# Patient Record
Sex: Male | Born: 1980 | Race: Black or African American | Hispanic: No | Marital: Single | Smoking: Never smoker
Health system: Southern US, Community
[De-identification: ages and names within clinical notes are randomized; demographics above are authoritative.]

## PROBLEM LIST (undated history)

## (undated) DIAGNOSIS — K219 Gastro-esophageal reflux disease without esophagitis: Secondary | ICD-10-CM

## (undated) HISTORY — PX: TOOTH EXTRACTION: SUR596

---
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
## 2005-11-25 ENCOUNTER — Emergency Department (HOSPITAL_COMMUNITY): Admission: EM | Admit: 2005-11-25 | Discharge: 2005-11-25 | Payer: Self-pay | Admitting: Emergency Medicine

## 2007-05-25 ENCOUNTER — Emergency Department (HOSPITAL_COMMUNITY): Admission: EM | Admit: 2007-05-25 | Discharge: 2007-05-25 | Payer: Self-pay | Admitting: Emergency Medicine

## 2009-02-24 ENCOUNTER — Emergency Department (HOSPITAL_BASED_OUTPATIENT_CLINIC_OR_DEPARTMENT_OTHER): Admission: EM | Admit: 2009-02-24 | Discharge: 2009-02-24 | Payer: Self-pay | Admitting: Emergency Medicine

## 2009-02-24 ENCOUNTER — Ambulatory Visit: Payer: Self-pay | Admitting: Diagnostic Radiology

## 2013-04-25 ENCOUNTER — Encounter (HOSPITAL_BASED_OUTPATIENT_CLINIC_OR_DEPARTMENT_OTHER): Payer: Self-pay | Admitting: *Deleted

## 2013-04-25 ENCOUNTER — Emergency Department (HOSPITAL_BASED_OUTPATIENT_CLINIC_OR_DEPARTMENT_OTHER)
Admission: EM | Admit: 2013-04-25 | Discharge: 2013-04-25 | Disposition: A | Discharge status: Home / Self Care | Payer: Self-pay | Attending: Emergency Medicine | Admitting: Emergency Medicine

## 2013-04-25 ENCOUNTER — Emergency Department (HOSPITAL_BASED_OUTPATIENT_CLINIC_OR_DEPARTMENT_OTHER): Payer: Self-pay

## 2013-04-25 DIAGNOSIS — R091 Pleurisy: Secondary | ICD-10-CM | POA: Insufficient documentation

## 2013-04-25 DIAGNOSIS — R0789 Other chest pain: Secondary | ICD-10-CM

## 2013-04-25 DIAGNOSIS — R071 Chest pain on breathing: Secondary | ICD-10-CM | POA: Insufficient documentation

## 2013-04-25 DIAGNOSIS — Z792 Long term (current) use of antibiotics: Secondary | ICD-10-CM | POA: Insufficient documentation

## 2013-04-25 DIAGNOSIS — F17200 Nicotine dependence, unspecified, uncomplicated: Secondary | ICD-10-CM | POA: Insufficient documentation

## 2013-04-25 LAB — COMPREHENSIVE METABOLIC PANEL
ALT: 10 U/L (ref 0–53)
AST: 12 U/L (ref 0–37)
BUN: 8 mg/dL (ref 6–23)
CO2: 27 mEq/L (ref 19–32)
Calcium: 9.8 mg/dL (ref 8.4–10.5)
Creatinine, Ser: 1 mg/dL (ref 0.50–1.35)
GFR calc Af Amer: >90 mL/min (ref >90)
GFR calc non Af Amer: >90 mL/min (ref >90)
Sodium: 142 mEq/L (ref 135–145)
Total Bilirubin: 0.6 mg/dL (ref 0.3–1.2)

## 2013-04-25 LAB — CBC WITH DIFFERENTIAL/PLATELET
Basophils Absolute: 0 10*3/uL (ref 0.0–0.1)
Basophils Relative: 0 % (ref 0–1)
Eosinophils Absolute: 0.1 10*3/uL (ref 0.0–0.7)
Hemoglobin: 13.4 g/dL (ref 13.0–17.0)
Lymphs Abs: 2.3 10*3/uL (ref 0.7–4.0)
MCH: 29.8 pg (ref 26.0–34.0)
MCHC: 32.4 g/dL (ref 30.0–36.0)
Monocytes Relative: 7 % (ref 3–12)
Neutrophils Relative %: 67 % (ref 43–77)
Platelets: 288 10*3/uL (ref 150–400)
RDW: 12.2 % (ref 11.5–15.5)
WBC: 9.3 10*3/uL (ref 4.0–10.5)

## 2013-04-25 LAB — D-DIMER, QUANTITATIVE: D-Dimer, Quant: <0.27 ug/mL-FEU (ref 0.00–0.48)

## 2013-04-25 MED ORDER — TRAMADOL HCL 50 MG PO TABS: 50.0000 mg | ORAL_TABLET | Freq: Four times a day (QID) | ORAL | Status: DC | PRN

## 2013-04-25 NOTE — ED Notes (Signed)
Pt amb to room 5 with quick steady gait in nad. Pt smiling, reports he has been "hitting the gym hard, every day", states he has had back pain for year, worse since Saturday, and "sharp" pains going through his back into his right rib area. Area is non-tender, pain increases with laying on that side, raising his right arm, and taking a deep breath. Pt states he googled his symptoms and became concerned, so came here to get checked.

## 2013-04-25 NOTE — ED Provider Notes (Signed)
CSN: [id number]     Arrival date & time 04/25/13  1004 History   First MD Initiated Contact with Patient 04/25/13 1053     Chief Complaint  Patient presents with  . Back Pain  . Pleurisy   (Consider location/radiation/quality/duration/timing/severity/associated sxs/prior Treatment) HPI Comments: Patient is an otherwise healthy 32 year old male presents with complaints of pain in the right chest. He states that this started Saturday shortly after a vigorous exercise routine at the gym. He is unsure if he strained something but has been uncomfortable since that time. His pain is worse with taking a deep breath and laying on his right side. He denies any fevers or chills. He denies any productive cough. He denies any recent exertional symptoms and normally gets there his workout routine without the symptoms.  Patient is a 32 y.o. male presenting with chest pain. The history is provided by the patient.  Chest Pain Pain location:  R chest Pain quality: sharp   Pain radiates to:  Does not radiate Pain radiates to the back: no   Pain severity:  Moderate Onset quality:  Sudden Duration:  3 days Timing:  Constant Progression:  Worsening Chronicity:  New Context: breathing, lifting and raising an arm   Relieved by:  Nothing Worsened by:  Coughing, deep breathing, certain positions and movement Ineffective treatments:  None tried   History reviewed. No pertinent past medical history. Past Surgical History  Procedure Laterality Date  . Tooth extraction     No family history on file. History  Substance Use Topics  . Smoking status: Current Every Day Smoker  . Smokeless tobacco: Not on file  . Alcohol Use: Not on file    Review of Systems  Cardiovascular: Positive for chest pain.  All other systems reviewed and are negative.    Allergies  Review of patient's allergies indicates no known allergies.  Home Medications   Current Outpatient Rx  Name  Route  Sig  Dispense  Refill   . penicillin v potassium (VEETID) 250 MG tablet   Oral   Take 250 mg by mouth 4 (four) times daily.          BP 119/67  Pulse 58  Temp(Src) 98.2 F (36.8 C) (Oral)  Resp 20  Ht 6' (1.829 m)  Wt 170 lb (77.111 kg)  BMI 23.05 kg/m2  SpO2 100% Physical Exam  Nursing note and vitals reviewed. Constitutional: He is oriented to person, place, and time. He appears well-developed and well-nourished. No distress.  HENT:  Head: Normocephalic and atraumatic.  Mouth/Throat: Oropharynx is clear and moist.  Neck: Normal range of motion. Neck supple.  Cardiovascular: Normal rate, regular rhythm and normal heart sounds.   No murmur heard. Pulmonary/Chest: Effort normal and breath sounds normal. No respiratory distress. He has no wheezes. He exhibits tenderness.  There is mild tenderness to palpation in the right lateral chest wall. Breath sounds are clear and equal bilaterally.  Abdominal: Soft. Bowel sounds are normal. He exhibits no distension. There is no tenderness.  Musculoskeletal: Normal range of motion. He exhibits edema.  Neurological: He is alert and oriented to person, place, and time.  Skin: Skin is warm and dry. He is not diaphoretic.    ED Course  Procedures (including critical care time) Labs Review Labs Reviewed  CBC WITH DIFFERENTIAL  TROPONIN I  D-DIMER, QUANTITATIVE  COMPREHENSIVE METABOLIC PANEL   Imaging Review No results found.   Date: 04/25/2013  Rate: 51  Rhythm: sinus bradycardia  QRS Axis: normal  Intervals: normal  ST/T Wave abnormalities: normal  Conduction Disutrbances:none  Narrative Interpretation:   Old EKG Reviewed: none available    MDM  No diagnosis found. Patient is an otherwise healthy 32 year old male who presents with pleuritic, reproducible right-sided chest pain. This started the day after a vigorous exercise routine. He stated he did a rowing machine for a prolonged period of time. I suspect that his symptoms are likely  musculoskeletal in nature. Workup confirms my suspicions as there is no evidence for cardiac ischemia. Troponin is negative EKG is normal. Chest x-ray does not reveal a pneumothorax or other pathology. D-dimer is negative and I will is suspicion for PE. I feel this sufficiently rule this possibility out. I will discharge him to home with anti-inflammatories, rest, and when necessary followup if not improving in the next few days.    Geoffery Lyons, MD 04/25/13 1213

## 2014-03-03 IMAGING — CR DG CHEST 2V
2 series · 2 of 2 positions shown · non-contrast
Comparison: None.

CLINICAL DATA: Right-sided chest pain and shortness of breath.

EXAM:
CHEST  2 VIEW

[w chest pa]
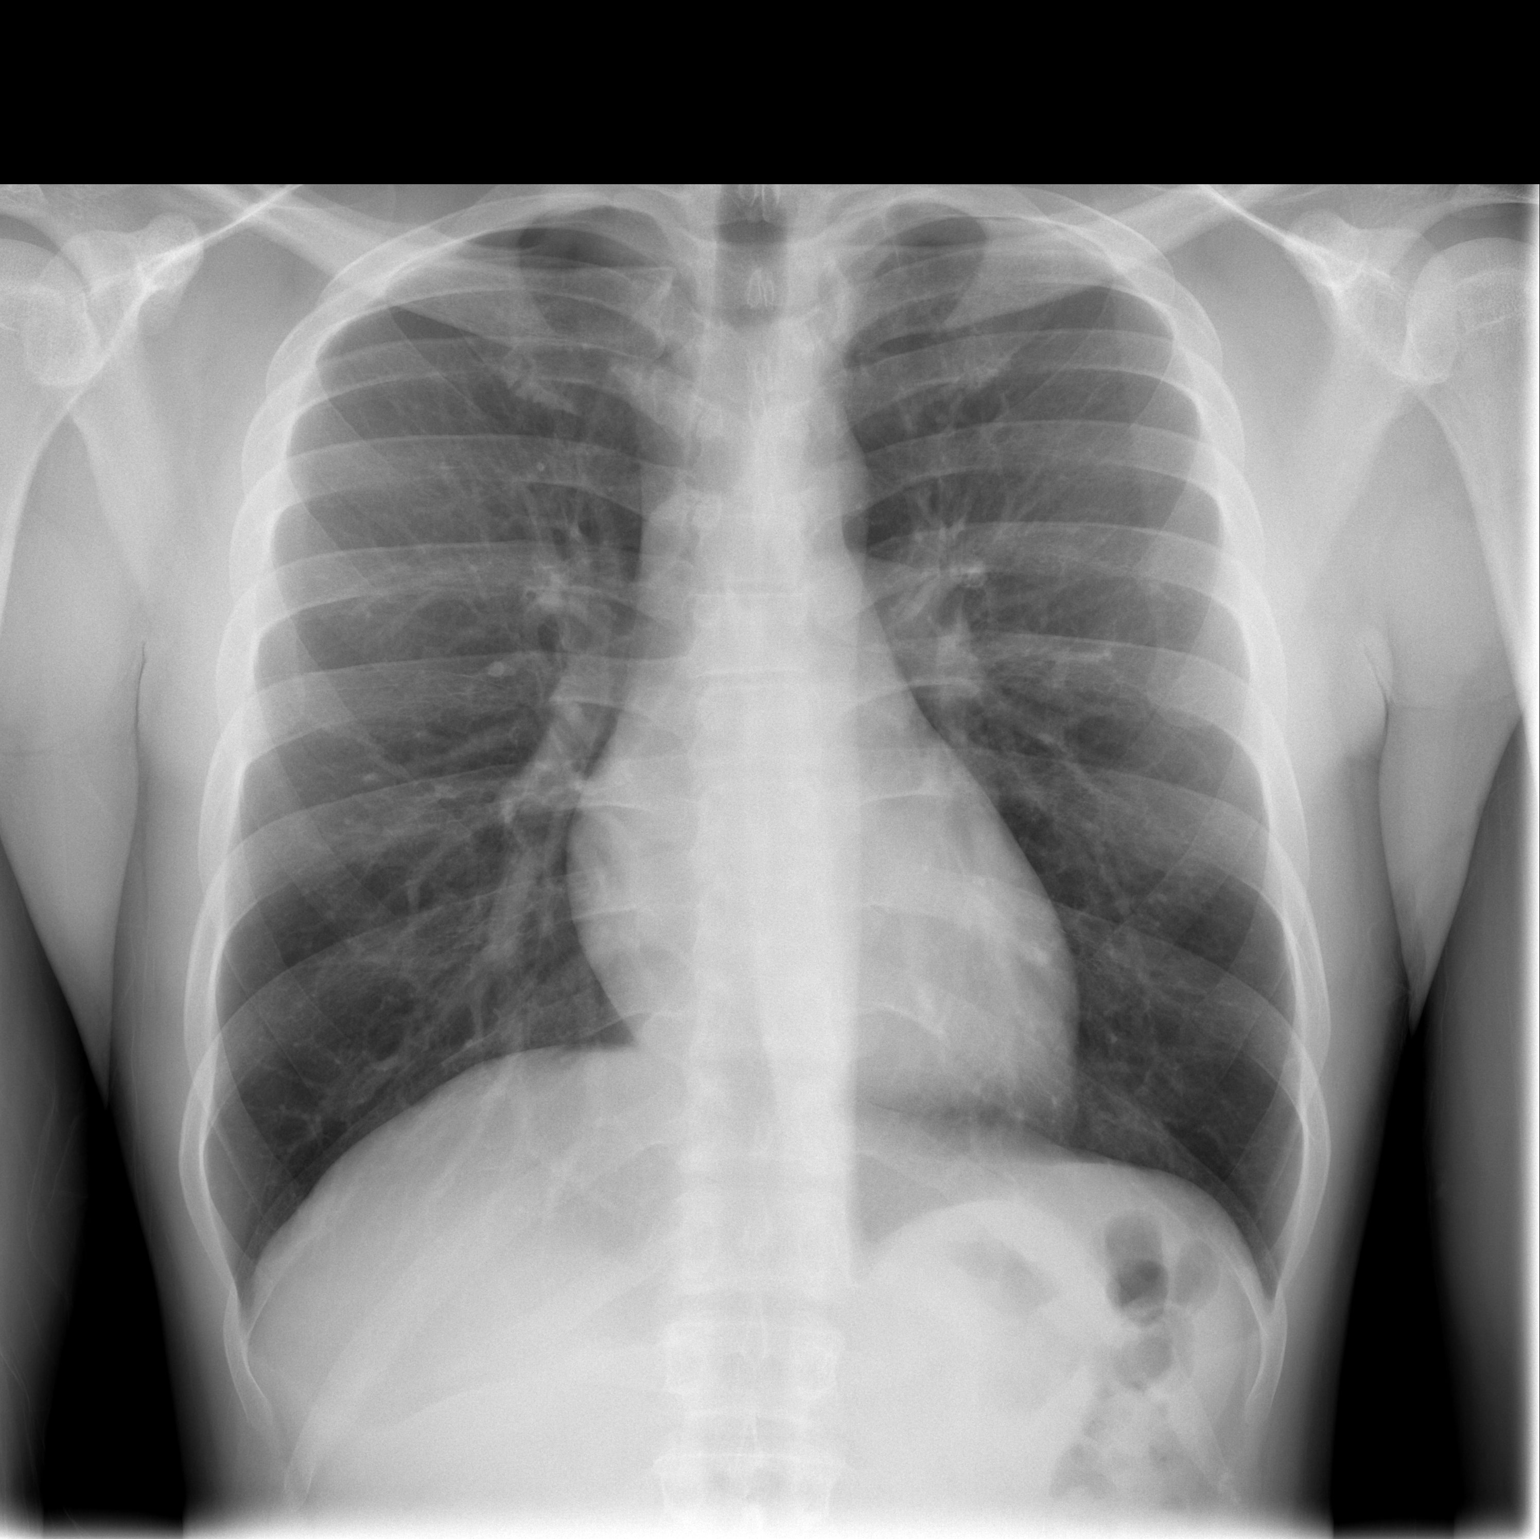

[w chest lat]
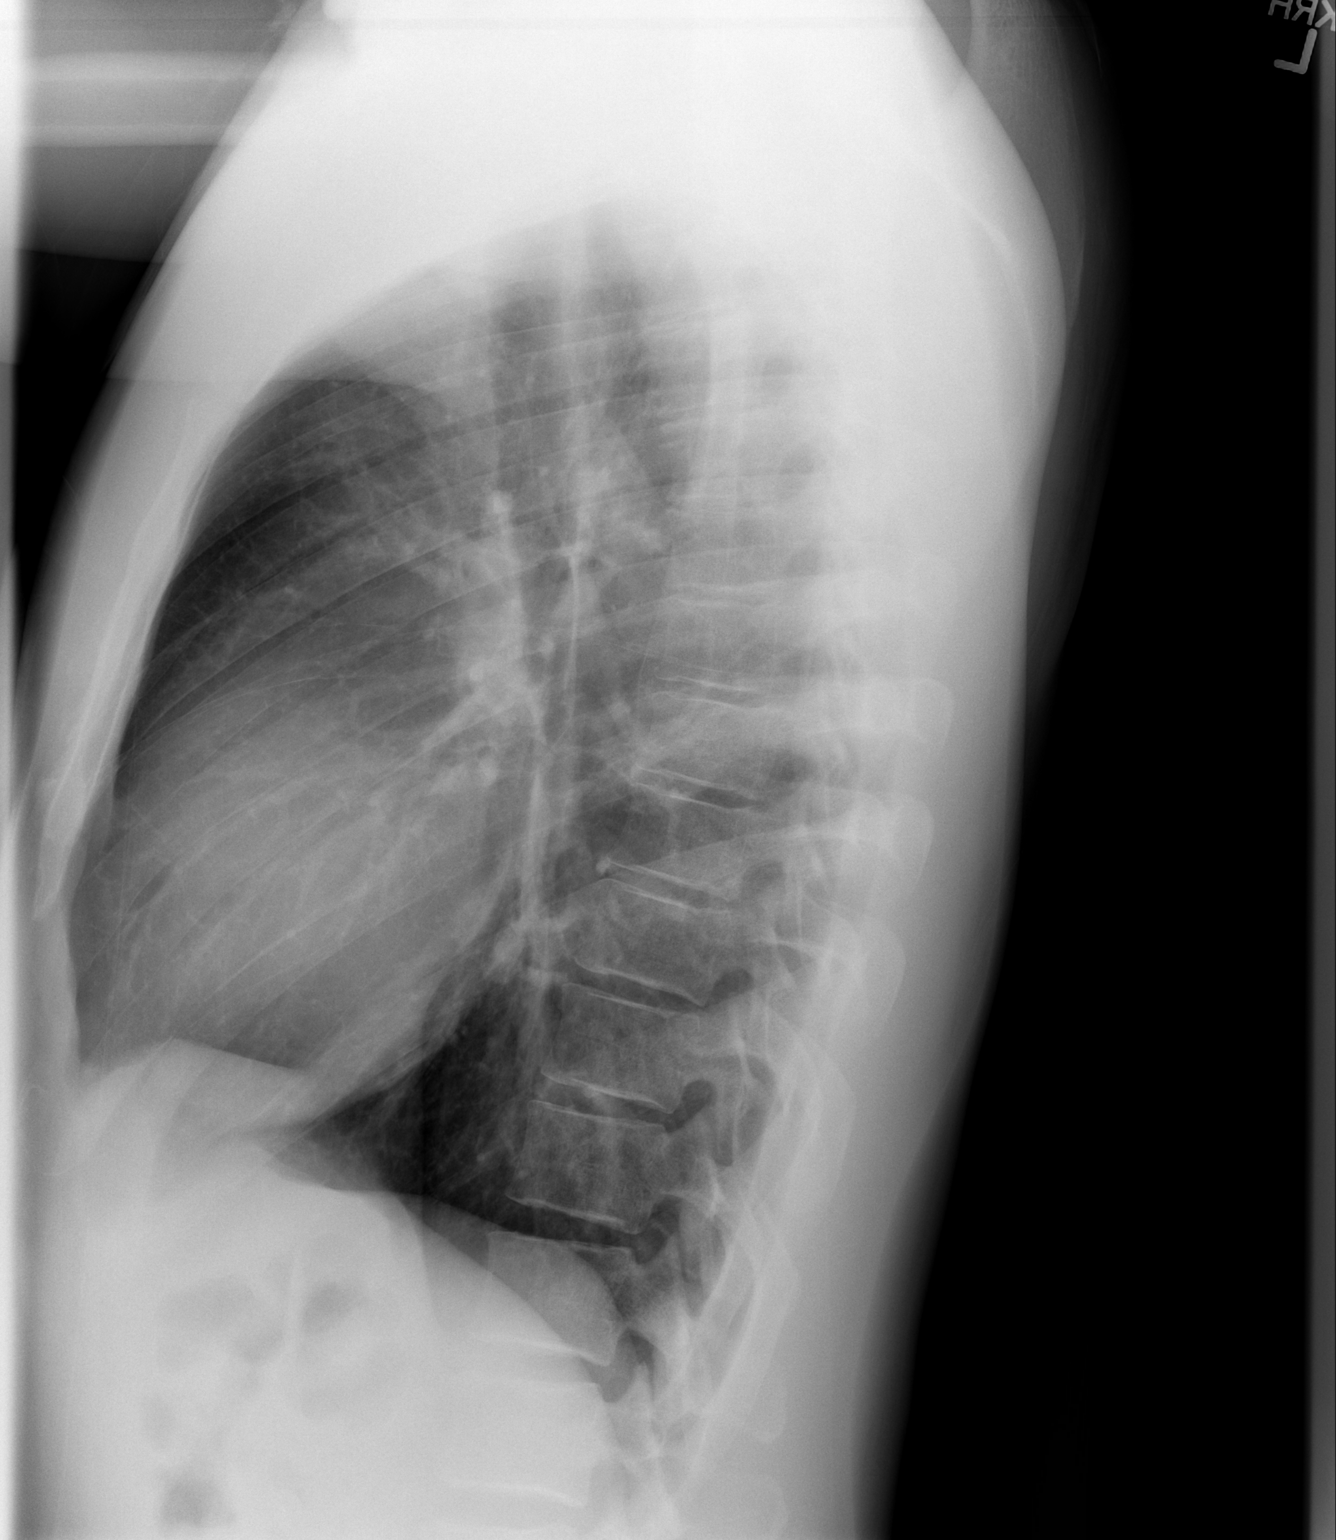

[2 of 2 positions shown; findings below may reference images not displayed]

FINDINGS: The heart size and mediastinal contours are within normal limits.
Both lungs are clear. The visualized skeletal structures are
unremarkable.
IMPRESSION: No active cardiopulmonary disease.

## 2016-01-09 ENCOUNTER — Emergency Department (HOSPITAL_BASED_OUTPATIENT_CLINIC_OR_DEPARTMENT_OTHER)
Admission: EM | Admit: 2016-01-09 | Discharge: 2016-01-09 | Disposition: A | Discharge status: Home / Self Care | Payer: Managed Care, Other (non HMO) | Attending: Emergency Medicine | Admitting: Emergency Medicine

## 2016-01-09 ENCOUNTER — Encounter (HOSPITAL_BASED_OUTPATIENT_CLINIC_OR_DEPARTMENT_OTHER): Payer: Self-pay | Admitting: Emergency Medicine

## 2016-01-09 DIAGNOSIS — M26629 Arthralgia of temporomandibular joint, unspecified side: Secondary | ICD-10-CM

## 2016-01-09 DIAGNOSIS — F17200 Nicotine dependence, unspecified, uncomplicated: Secondary | ICD-10-CM | POA: Diagnosis not present

## 2016-01-09 DIAGNOSIS — R6884 Jaw pain: Secondary | ICD-10-CM | POA: Diagnosis present

## 2016-01-09 DIAGNOSIS — M26621 Arthralgia of right temporomandibular joint: Secondary | ICD-10-CM | POA: Insufficient documentation

## 2016-01-09 MED ORDER — ACETAMINOPHEN 500 MG PO TABS
1000.0000 mg | ORAL_TABLET | Freq: Once | ORAL | Status: AC
  Administered 2016-01-09: 1000 mg via ORAL
  Filled 2016-01-09: qty 2

## 2016-01-09 MED ORDER — IBUPROFEN 800 MG PO TABS
800.0000 mg | ORAL_TABLET | Freq: Once | ORAL | Status: AC
  Administered 2016-01-09: 800 mg via ORAL
  Filled 2016-01-09: qty 1

## 2016-01-09 NOTE — Discharge Instructions (Signed)
Take 4 over the counter ibuprofen tablets 3 times a day or 2 over-the-counter naproxen tablets twice a day for pain. ° °

## 2016-01-09 NOTE — ED Provider Notes (Signed)
CSN: [id number]     Arrival date & time 01/09/16  1347 History   First MD Initiated Contact with Patient 01/09/16 1506     Chief Complaint  Patient presents with  . Jaw Pain     (Consider location/radiation/quality/duration/timing/severity/associated sxs/prior Treatment) Patient is a 35 y.o. male presenting with mouth injury. The history is provided by the patient.  Mouth Injury This is a new problem. The current episode started 12 to 24 hours ago. The problem occurs constantly. The problem has not changed since onset.Pertinent negatives include no chest pain, no abdominal pain, no headaches and no shortness of breath. Exacerbated by: chewing. Nothing relieves the symptoms. He has tried nothing for the symptoms. The treatment provided no relief.    35 yo M With a chief complaint of right-sided jaw pain. He notices this morning. Feels that radiates into his right ear. Feels that the pain is pulsating. Worse with chewing. Denies any dental pain. Denies fevers or chills.  On ROS patient states that he has felt a bit lightheaded sometimes when he lays back. Denies any neck pain. He also sometimes feels like his to pass out when he is lifting weights above his head.  History reviewed. No pertinent past medical history. Past Surgical History  Procedure Laterality Date  . Tooth extraction     History reviewed. No pertinent family history. Social History  Substance Use Topics  . Smoking status: Current Every Day Smoker  . Smokeless tobacco: None  . Alcohol Use: None    Review of Systems  Constitutional: Negative for fever and chills.  HENT: Negative for congestion and facial swelling.        Jaw pain  Eyes: Negative for discharge and visual disturbance.  Respiratory: Negative for shortness of breath.   Cardiovascular: Negative for chest pain and palpitations.  Gastrointestinal: Negative for vomiting, abdominal pain and diarrhea.  Musculoskeletal: Negative for myalgias and arthralgias.   Skin: Negative for color change and rash.  Neurological: Negative for tremors, syncope and headaches.  Psychiatric/Behavioral: Negative for confusion and dysphoric mood.      Allergies  Review of patient's allergies indicates no known allergies.  Home Medications   Prior to Admission medications   Medication Sig Start Date End Date Taking? Authorizing Provider  penicillin v potassium (VEETID) 250 MG tablet Take 250 mg by mouth 4 (four) times daily.    Historical Provider, MD  traMADol (ULTRAM) 50 MG tablet Take 1 tablet (50 mg total) by mouth every 6 (six) hours as needed for pain. 04/25/13   Geoffery Lyonsouglas Delo, MD   BP 110/68 mmHg  Pulse 61  Temp(Src) 98.7 F (37.1 C) (Oral)  Resp 18  Ht 6' (1.829 m)  Wt 182 lb (82.555 kg)  BMI 24.68 kg/m2  SpO2 100% Physical Exam  Constitutional: He is oriented to person, place, and time. He appears well-developed and well-nourished.  HENT:  Head: Normocephalic and atraumatic.  Tender palpation of the right TMJ. Right and left TMs are normal. No intraoral pathology.  Eyes: EOM are normal. Pupils are equal, round, and reactive to light.  Neck: Normal range of motion. Neck supple. No JVD present.  Cardiovascular: Normal rate and regular rhythm.  Exam reveals no gallop and no friction rub.   No murmur heard. Pulmonary/Chest: No respiratory distress. He has no wheezes.  Abdominal: He exhibits no distension. There is no tenderness. There is no rebound and no guarding.  Musculoskeletal: Normal range of motion.  Neurological: He is alert and oriented to person,  place, and time.  Skin: No rash noted. No pallor.  Psychiatric: He has a normal mood and affect. His behavior is normal.  Nursing note and vitals reviewed.   ED Course  Procedures (including critical care time) Labs Review Labs Reviewed - No data to display  Imaging Review No results found. I have personally reviewed and evaluated these images and lab results as part of my medical  decision-making.   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 Patient has a couple other complaints that may be concerning though no bruits are heard on exam patient has no loss of pulse of his upper extremity with range of motion. We'll have him follow-up with a family doctor for further evaluation.  3:29 PM:  I have discussed the diagnosis/risks/treatment options with the patient and believe the pt to be eligible for discharge home to follow-up with PCP. We also discussed returning to the ED immediately if new or worsening sx occur. We discussed the sx which are most concerning (e.g., sudden worsening pain, fever,syncope) that necessitate immediate return. Medications administered to the patient during their visit and any new prescriptions provided to the patient are listed below.  Medications given during this visit Medications  acetaminophen (TYLENOL) tablet 1,000 mg (1,000 mg Oral Given 01/09/16 1527)  ibuprofen (ADVIL,MOTRIN) tablet 800 mg (800 mg Oral Given 01/09/16 1527)    New Prescriptions   No medications on file    The patient appears reasonably screen and/or stabilized for discharge and I doubt any other medical condition or other [facility] requiring further screening, evaluation, or treatment in the ED at this time prior to discharge.     Melene Planan Cruze Zingaro, DO 01/09/16 1529

## 2016-01-09 NOTE — ED Notes (Signed)
Patient states that he is having right sides jaw pain up to his ear and a headache. Woke up this am with it.

## 2016-08-31 ENCOUNTER — Encounter (HOSPITAL_BASED_OUTPATIENT_CLINIC_OR_DEPARTMENT_OTHER): Payer: Self-pay | Admitting: *Deleted

## 2016-08-31 ENCOUNTER — Emergency Department (HOSPITAL_BASED_OUTPATIENT_CLINIC_OR_DEPARTMENT_OTHER)
Admission: EM | Admit: 2016-08-31 | Discharge: 2016-08-31 | Disposition: A | Discharge status: Home / Self Care | Payer: Managed Care, Other (non HMO) | Attending: Emergency Medicine | Admitting: Emergency Medicine

## 2016-08-31 DIAGNOSIS — R072 Precordial pain: Secondary | ICD-10-CM | POA: Diagnosis present

## 2016-08-31 DIAGNOSIS — F17200 Nicotine dependence, unspecified, uncomplicated: Secondary | ICD-10-CM | POA: Insufficient documentation

## 2016-08-31 DIAGNOSIS — K2100 Gastro-esophageal reflux disease with esophagitis, without bleeding: Secondary | ICD-10-CM

## 2016-08-31 MED ORDER — GI COCKTAIL ~~LOC~~
30.0000 mL | Freq: Once | ORAL | Status: AC
  Administered 2016-08-31: 30 mL via ORAL
  Filled 2016-08-31: qty 30

## 2016-08-31 MED ORDER — OMEPRAZOLE 20 MG PO CPDR: 20.0000 mg | DELAYED_RELEASE_CAPSULE | Freq: Every day | ORAL | 0 refills | Status: AC

## 2016-08-31 NOTE — ED Notes (Signed)
Patient denies pain and is resting comfortably.  

## 2016-08-31 NOTE — ED Triage Notes (Signed)
Pain on the right side of his chest x 4 days. Pain goes into his right upper abdomen when he taps on his chest per pt.

## 2016-08-31 NOTE — ED Provider Notes (Signed)
[facility] Provider Note   CSN: [id number] Arrival date & time: 08/31/16  1431  By signing my name below, I, Rosario Adie, attest that this documentation has been prepared under the direction and in the presence of Lyndal Pulley, MD. Electronically Signed: Rosario Adie, ED Scribe. 08/31/16. 5:59 PM.  History   Chief Complaint Chief Complaint  Patient presents with  . Chest Pain   The history is provided by the patient. No language interpreter was used.  Chest Pain   This is a new problem. The current episode started more than 2 days ago. Episode frequency: intermittently. The problem has not changed since onset.The pain is associated with eating. The pain is present in the substernal region. The pain is at a severity of 6/10. The pain is moderate. The quality of the pain is described as pressure-like. The pain does not radiate. Pertinent negatives include no diaphoresis, no fever, no nausea, no shortness of breath and no vomiting. He has tried nothing for the symptoms. The treatment provided no relief. Risk factors include male gender.    HPI Comments: Frank Haynes is a 36 y.o. male with no pertinent PMHx, who presents to the Emergency Department complaining of persistent substernal chest pain beginning three days ago. He describes his pain as pressure-like. Pt notes that his pain was exacerbated following eating a spicy food two days ago. No OTC medications or home remedies tried PTA for his pain. He notes that he has previously had a strained muscle to the area before, but his pain feels non-similar to this. His pain is non-exertional. He denies dyspnea, shortness of breath, nausea, vomiting, diaphoresis, fever, leg swelling, or any other associated symptoms.    History reviewed. No pertinent past medical history.  There are no active problems to display for this patient.  Past Surgical History:  Procedure Laterality Date  . TOOTH EXTRACTION      Home  Medications    Prior to Admission medications   Medication Sig Start Date End Date Taking? Authorizing Provider  penicillin v potassium (VEETID) 250 MG tablet Take 250 mg by mouth 4 (four) times daily.    Historical Provider, MD  traMADol (ULTRAM) 50 MG tablet Take 1 tablet (50 mg total) by mouth every 6 (six) hours as needed for pain. 04/25/13   Geoffery Lyons, MD   Family History No family history on file.  Social History Social History  Substance Use Topics  . Smoking status: Current Every Day Smoker  . Smokeless tobacco: Never Used  . Alcohol use Yes   Allergies   Patient has no known allergies.  Review of Systems Review of Systems  Constitutional: Negative for diaphoresis and fever.  Respiratory: Negative for shortness of breath.   Cardiovascular: Positive for chest pain. Negative for leg swelling.  Gastrointestinal: Negative for nausea and vomiting.  All other systems reviewed and are negative.  Physical Exam Updated Vital Signs BP 106/67 (BP Location: Right Arm)   Pulse 62   Temp 99.3 F (37.4 C) (Oral)   Resp 16   Ht 6' (1.829 m)   Wt 185 lb (83.9 kg)   SpO2 98%   BMI 25.09 kg/m   Physical Exam  Constitutional: He is oriented to person, place, and time. He appears well-developed and well-nourished. No distress.  HENT:  Head: Normocephalic and atraumatic.  Right Ear: External ear normal.  Left Ear: External ear normal.  Nose: Nose normal.  Mouth/Throat: Oropharynx is clear and moist.  Eyes: Conjunctivae are  normal.  Neck: Normal range of motion. Neck supple. No tracheal deviation present.  Cardiovascular: Normal rate, regular rhythm and normal heart sounds.  Exam reveals no gallop and no friction rub.   No murmur heard. Pulmonary/Chest: Effort normal and breath sounds normal. No respiratory distress. He has no wheezes. He has no rales. He exhibits no tenderness.  Abdominal: Soft. Bowel sounds are normal. He exhibits no distension. There is no tenderness.  There is no rebound and no guarding.  Musculoskeletal: Normal range of motion. He exhibits no tenderness.  Neurological: He is alert and oriented to person, place, and time.  Skin: Skin is warm and dry. Capillary refill takes less than 2 seconds.  Psychiatric: He has a normal mood and affect.  Nursing note and vitals reviewed.  ED Treatments / Results  DIAGNOSTIC STUDIES: Oxygen Saturation is 98% on RA, normal by my interpretation.   COORDINATION OF CARE: 5:58 PM-Discussed next steps with pt. Pt verbalized understanding and is agreeable with the plan.   Labs (all labs ordered are listed, but only abnormal results are displayed) Labs Reviewed - No data to display  EKG  EKG Interpretation None      Radiology No results found.  Procedures Procedures   Medications Ordered in ED Medications - No data to display  Initial Impression / Assessment and Plan / ED Course  I have reviewed the triage vital signs and the nursing notes.  Pertinent labs & imaging results that were available during my care of the patient were reviewed by me and considered in my medical decision making (see chart for details).     36 year old male presents with upper abdominal discomfort and a feeling of chest pressure that got worse after eating jerk chicken that was spicy on Friday. Highly atypical symptoms for ACS with normal EKG and low risk by HEAR score and I do not feel troponin testing is necessary at this time. Treated symptomatically with GI cocktail and plan to start PPI therapy and follow-up with primary care physician establishment visit.  Final Clinical Impressions(s) / ED Diagnoses   Final diagnoses:  GERD with esophagitis   New Prescriptions New Prescriptions   OMEPRAZOLE (PRILOSEC) 20 MG CAPSULE    Take 1 capsule (20 mg total) by mouth daily.   I personally performed the services described in this documentation, which was scribed in my presence. The recorded information has been  reviewed and is accurate.      Lyndal Pulleyaniel Jacqueline Spofford, MD 08/31/16 [phone]

## 2017-06-06 ENCOUNTER — Emergency Department (HOSPITAL_BASED_OUTPATIENT_CLINIC_OR_DEPARTMENT_OTHER): Payer: 59

## 2017-06-06 ENCOUNTER — Emergency Department (HOSPITAL_BASED_OUTPATIENT_CLINIC_OR_DEPARTMENT_OTHER)
Admission: EM | Admit: 2017-06-06 | Discharge: 2017-06-06 | Disposition: A | Discharge status: Home / Self Care | Payer: 59 | Attending: Emergency Medicine | Admitting: Emergency Medicine

## 2017-06-06 ENCOUNTER — Encounter (HOSPITAL_BASED_OUTPATIENT_CLINIC_OR_DEPARTMENT_OTHER): Payer: Self-pay | Admitting: Emergency Medicine

## 2017-06-06 ENCOUNTER — Other Ambulatory Visit: Payer: Self-pay

## 2017-06-06 DIAGNOSIS — M940 Chondrocostal junction syndrome [Tietze]: Secondary | ICD-10-CM | POA: Insufficient documentation

## 2017-06-06 DIAGNOSIS — Z79899 Other long term (current) drug therapy: Secondary | ICD-10-CM | POA: Insufficient documentation

## 2017-06-06 DIAGNOSIS — R079 Chest pain, unspecified: Secondary | ICD-10-CM

## 2017-06-06 DIAGNOSIS — R0789 Other chest pain: Secondary | ICD-10-CM | POA: Insufficient documentation

## 2017-06-06 HISTORY — DX: Gastro-esophageal reflux disease without esophagitis: K21.9

## 2017-06-06 LAB — CBC WITH DIFFERENTIAL/PLATELET
Basophils Absolute: 0 10*3/uL (ref 0.0–0.1)
Basophils Relative: 0 %
Eosinophils Absolute: 0.1 10*3/uL (ref 0.0–0.7)
Eosinophils Relative: 1 %
HCT: 41.7 % (ref 39.0–52.0)
Hemoglobin: 13.5 g/dL (ref 13.0–17.0)
Lymphocytes Relative: 35 %
Lymphs Abs: 2.8 10*3/uL (ref 0.7–4.0)
MCH: 29.7 pg (ref 26.0–34.0)
MCHC: 32.4 g/dL (ref 30.0–36.0)
MCV: 91.9 fL (ref 78.0–100.0)
Monocytes Absolute: 0.6 10*3/uL (ref 0.1–1.0)
Monocytes Relative: 8 %
Neutro Abs: 4.6 10*3/uL (ref 1.7–7.7)
Neutrophils Relative %: 56 %
Platelets: 278 10*3/uL (ref 150–400)
RBC: 4.54 MIL/uL (ref 4.22–5.81)
RDW: 12 % (ref 11.5–15.5)
WBC: 8.1 10*3/uL (ref 4.0–10.5)

## 2017-06-06 LAB — BASIC METABOLIC PANEL
Anion gap: 4 — ABNORMAL LOW (ref 5–15)
BUN: 12 mg/dL (ref 6–20)
CO2: 26 mmol/L (ref 22–32)
Calcium: 9.2 mg/dL (ref 8.9–10.3)
Chloride: 107 mmol/L (ref 101–111)
Creatinine, Ser: 1.03 mg/dL (ref 0.61–1.24)
GFR calc Af Amer: >60 mL/min (ref >60)
GFR calc non Af Amer: >60 mL/min (ref >60)
Glucose, Bld: 90 mg/dL (ref 65–99)
Potassium: 3.9 mmol/L (ref 3.5–5.1)
Sodium: 137 mmol/L (ref 135–145)

## 2017-06-06 LAB — TROPONIN I: Troponin I: <0.03 ng/mL (ref <0.03)

## 2017-06-06 LAB — D-DIMER, QUANTITATIVE (NOT AT ARMC): D-Dimer, Quant: 0.28 ug/mL-FEU (ref 0.00–0.50)

## 2017-06-06 MED ORDER — IBUPROFEN 800 MG PO TABS: 800.0000 mg | ORAL_TABLET | Freq: Three times a day (TID) | ORAL | 0 refills | Status: AC | PRN

## 2017-06-06 MED ORDER — IBUPROFEN 800 MG PO TABS
800.0000 mg | ORAL_TABLET | Freq: Once | ORAL | Status: AC
  Administered 2017-06-06: 800 mg via ORAL
  Filled 2017-06-06: qty 1

## 2017-06-06 NOTE — ED Triage Notes (Signed)
Pt c/o middle and RT side CP x 1 wk; RT side pain increases with movement

## 2017-06-06 NOTE — ED Notes (Signed)
Patient transported to X-ray 

## 2017-06-06 NOTE — ED Provider Notes (Signed)
[facility] EMERGENCY DEPARTMENT Provider Note   CSN: [id number] Arrival date & time: 06/06/17  1137     History   Chief Complaint Chief Complaint  Patient presents with  . Chest Pain    HPI Frank Haynes is a 36 y.o. male.  HPI Patient presents to the emergency department with chest pain that is mainly midsternal into the right side of his cheSt over the last week.  The patient states that has been constant seems to be worse with certain positions and movement along with deep breathing.  The patient states that he does not have any medical problems.  The patient states he has had no history of blood clots.  The patient states that when he did not take any medications other than Tylenol prior to arrival.  The patient states that gave him some mild relief.  Patient states that he has never had this type of pain in the past.  Patient denies any cardiac history.  Patient does not smoke cigarettes however he does smoke marijuana.  The patient denies diaphoresis, shortness of breath, headache,blurred vision, neck pain, fever, cough, weakness, numbness, dizziness, anorexia, edema, abdominal pain, nausea, vomiting, diarrhea, rash, back pain, dysuria, hematemesis,near syncope, or syncope. Past Medical History:  Diagnosis Date  . GERD (gastroesophageal reflux disease)     There are no active problems to display for this patient.   Past Surgical History:  Procedure Laterality Date  . TOOTH EXTRACTION         Home Medications    Prior to Admission medications   Medication Sig Start Date End Date Taking? Authorizing Provider  omeprazole (PRILOSEC) 20 MG capsule Take 1 capsule (20 mg total) by mouth daily. 08/31/16   Lyndal PulleyKnott, Daniel, MD  penicillin v potassium (VEETID) 250 MG tablet Take 250 mg by mouth 4 (four) times daily.    [provider]  traMADol (ULTRAM) 50 MG tablet Take 1 tablet (50 mg total) by mouth every 6 (six) hours as needed for pain. 04/25/13   Geoffery Lyonselo,  Douglas, MD    Family History No family history on file.  Social History Social History   Tobacco Use  . Smoking status: Never Smoker  . Smokeless tobacco: Never Used  Substance Use Topics  . Alcohol use: Yes    Comment: occ  . Drug use: Yes    Types: Marijuana     Allergies   Patient has no known allergies.   Review of Systems Review of Systems All other systems negative except as documented in the HPI. All pertinent positives and negatives as reviewed in the HPI. Dog physical Exam Updated Vital Signs BP 111/61 (BP Location: Left Arm)   Pulse 72   Temp 98.1 F (36.7 C) (Oral)   Resp 15   Ht 5\' 11"  (1.803 m)   Wt 79.4 kg (175 lb)   SpO2 99%   BMI 24.41 kg/m   Physical Exam  Constitutional: He is oriented to person, place, and time. He appears well-developed and well-nourished. No distress.  HENT:  Head: Normocephalic and atraumatic.  Mouth/Throat: Oropharynx is clear and moist.  Eyes: Pupils are equal, round, and reactive to light.  Neck: Normal range of motion. Neck supple.  Cardiovascular: Normal rate, regular rhythm and normal heart sounds. Exam reveals no gallop and no friction rub.  No murmur heard. Pulmonary/Chest: Effort normal and breath sounds normal. No respiratory distress. He has no wheezes.  Abdominal: Soft. Bowel sounds are normal. He exhibits no distension. There is no  tenderness.  Neurological: He is alert and oriented to person, place, and time. He exhibits normal muscle tone. Coordination normal.  Skin: Skin is warm and dry. Capillary refill takes less than 2 seconds. No rash noted. No erythema.  Psychiatric: He has a normal mood and affect. His behavior is normal.  Nursing note and vitals reviewed.    ED Treatments / Results  Labs (all labs ordered are listed, but only abnormal results are displayed) Labs Reviewed  BASIC METABOLIC PANEL - Abnormal; Notable for the following components:      Result Value   Anion gap 4 (*)    All other  components within normal limits  TROPONIN I  D-DIMER, QUANTITATIVE (NOT AT [facility])  CBC WITH DIFFERENTIAL/PLATELET    EKG  EKG Interpretation None       Radiology Dg Chest 2 View  Result Date: 06/06/2017 CLINICAL DATA:  Mid sternal chest pain for 1 week EXAM: CHEST  2 VIEW COMPARISON:  04/25/2013 FINDINGS: Heart and mediastinal contours are within normal limits. No focal opacities or effusions. No acute bony abnormality. IMPRESSION: No active cardiopulmonary disease. Electronically Signed   By: Charlett NoseKevin  Dover M.D.   On: 06/06/2017 12:46    Procedures Procedures (including critical care time)  Medications Ordered in ED Medications  ibuprofen (ADVIL,MOTRIN) tablet 800 mg (not administered)     Initial Impression / Assessment and Plan / ED Course  I have reviewed the triage vital signs and the nursing notes.  Pertinent labs & imaging results that were available during my care of the patient were reviewed by me and considered in my medical decision making (see chart for details).   Patient has low risk chest pain based on his HPI and physical exam findings along with laboratory testing.  I will have the patient return here as needed I have advised him he will need to follow-up with the primary care doctor.  I feel that this is chest wall pain.  Patient is advised to use ice and heat on the area that is sore. patient agrees the plan and all questions were answered    Final Clinical Impressions(s) / ED Diagnoses   Final diagnoses:  None    ED Discharge Orders    None       Charlestine NightLawyer, Marcella Dunnaway, PA-C 06/06/17 1358    Shaune PollackIsaacs, Cameron, MD 06/07/17 [phone]

## 2017-06-06 NOTE — Discharge Instructions (Signed)
Your testing here today did not show any abnormalities.  This is most likely chest wall strain and irritation.  Follow-up with a primary doctor.  Use ice and heat on the area that is sore.

## 2018-04-14 IMAGING — CR DG CHEST 2V
2 series · 2 of 2 positions shown · non-contrast
Comparison: 04/25/2013

CLINICAL DATA: Mid sternal chest pain for 1 week

EXAM:
CHEST  2 VIEW

[w chest pa]
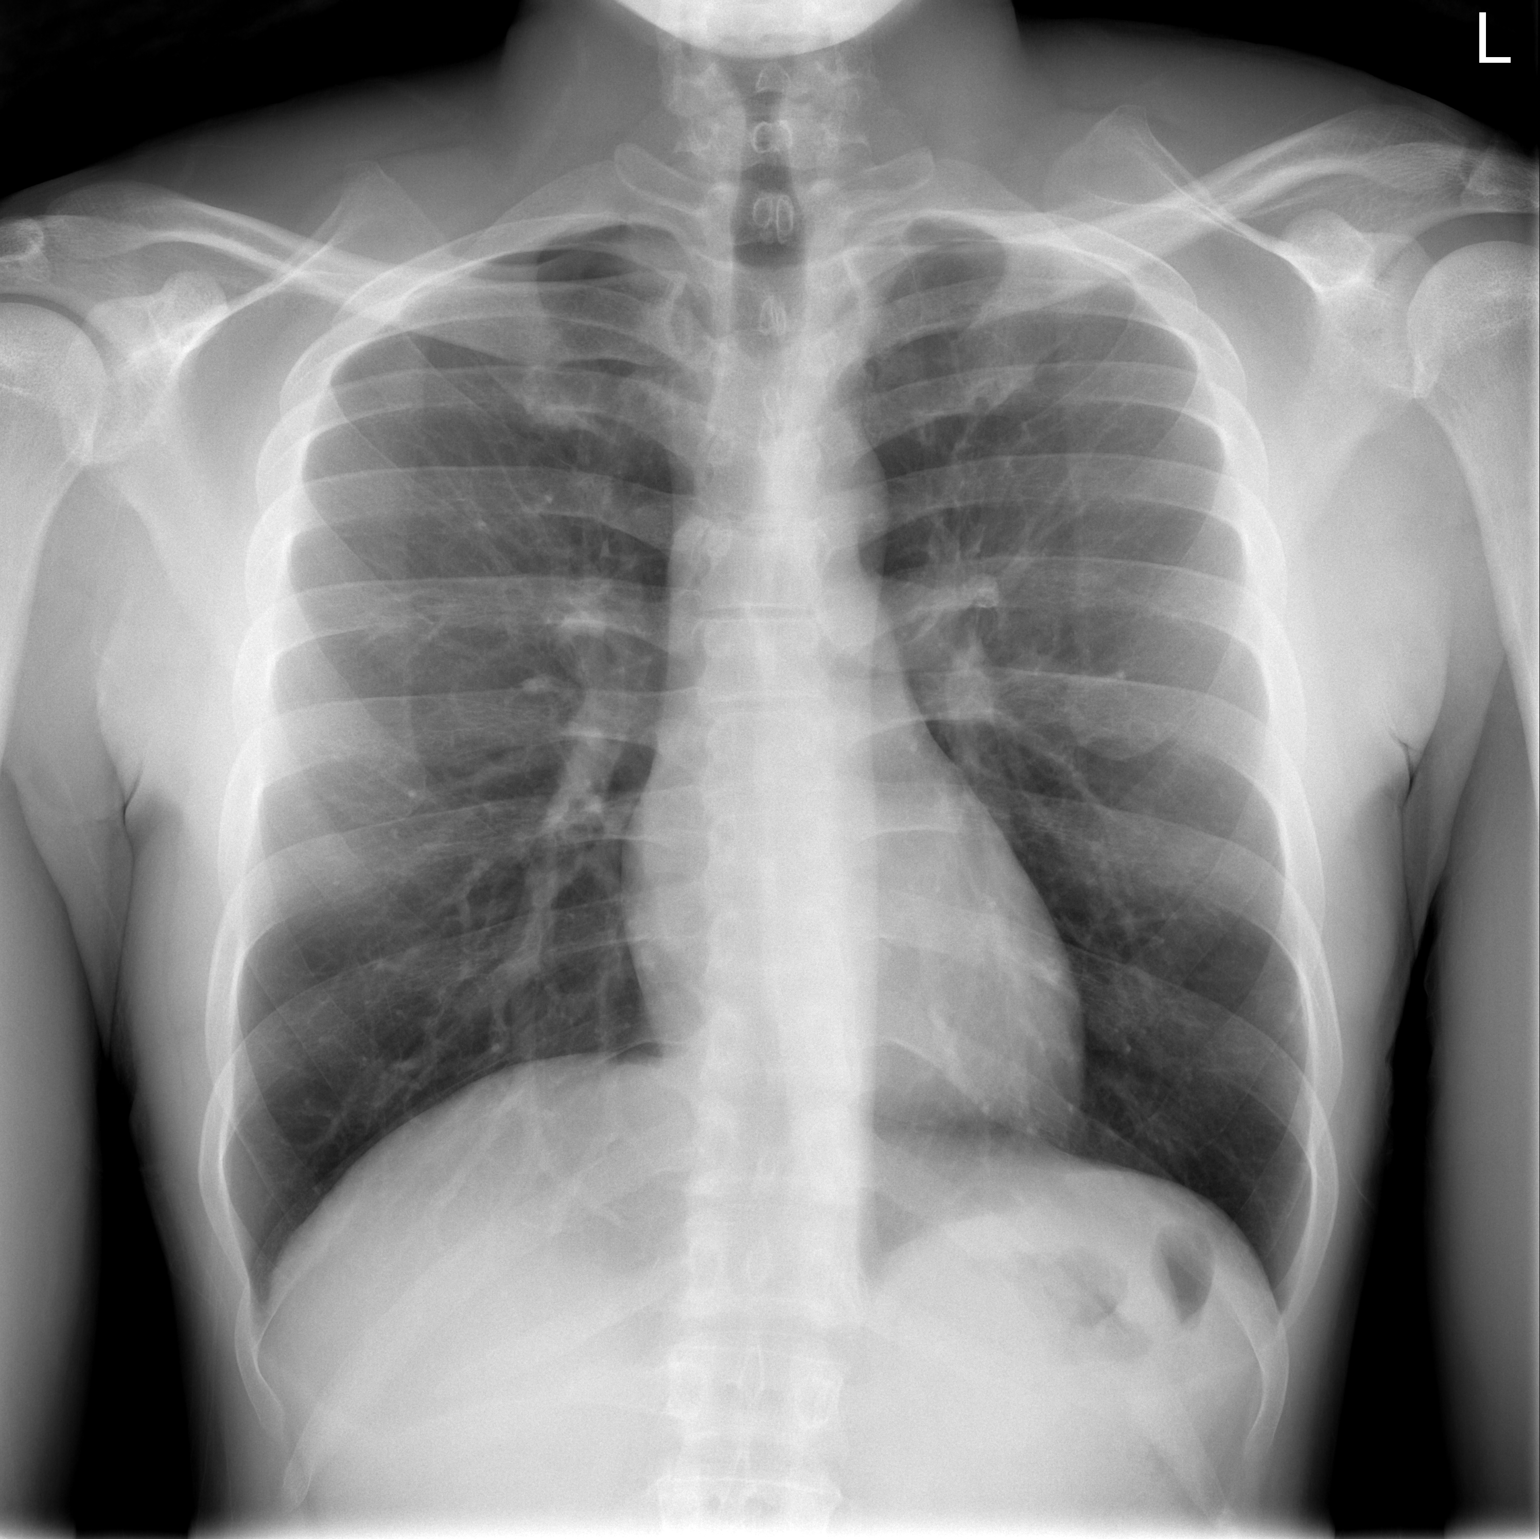

[w chest lat]
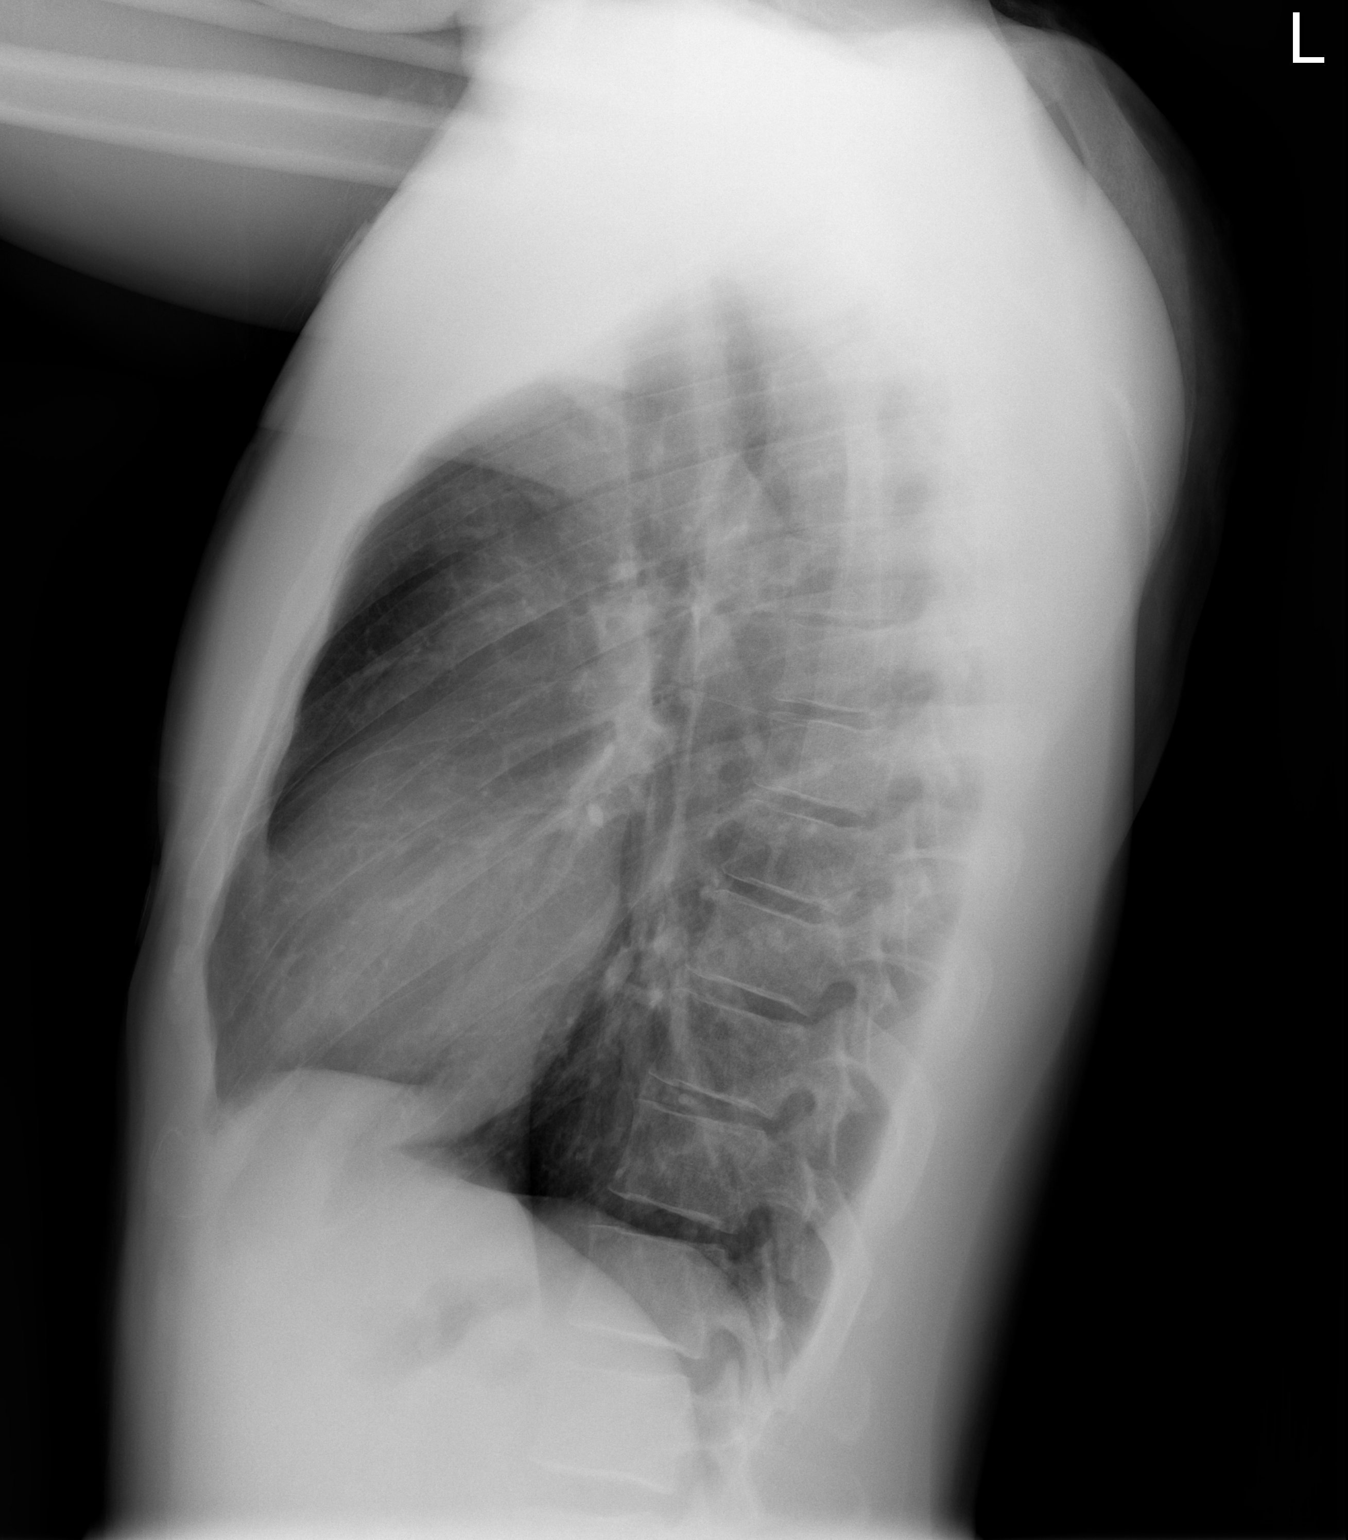

[2 of 2 positions shown; findings below may reference images not displayed]

FINDINGS: Heart and mediastinal contours are within normal limits. No focal
opacities or effusions. No acute bony abnormality.
IMPRESSION: No active cardiopulmonary disease.

## 2021-10-20 ENCOUNTER — Ambulatory Visit: Payer: 59 | Admitting: Emergency Medicine

## 2022-06-02 ENCOUNTER — Emergency Department (HOSPITAL_BASED_OUTPATIENT_CLINIC_OR_DEPARTMENT_OTHER): Payer: No Typology Code available for payment source

## 2022-06-02 ENCOUNTER — Emergency Department (HOSPITAL_BASED_OUTPATIENT_CLINIC_OR_DEPARTMENT_OTHER)
Admission: EM | Admit: 2022-06-02 | Discharge: 2022-06-02 | Disposition: A | Discharge status: Home / Self Care | Payer: No Typology Code available for payment source | Attending: Emergency Medicine | Admitting: Emergency Medicine

## 2022-06-02 ENCOUNTER — Encounter (HOSPITAL_BASED_OUTPATIENT_CLINIC_OR_DEPARTMENT_OTHER): Payer: Self-pay | Admitting: Emergency Medicine

## 2022-06-02 ENCOUNTER — Other Ambulatory Visit: Payer: Self-pay

## 2022-06-02 DIAGNOSIS — R42 Dizziness and giddiness: Secondary | ICD-10-CM | POA: Diagnosis not present

## 2022-06-02 DIAGNOSIS — R519 Headache, unspecified: Secondary | ICD-10-CM | POA: Insufficient documentation

## 2022-06-02 DIAGNOSIS — D72829 Elevated white blood cell count, unspecified: Secondary | ICD-10-CM | POA: Insufficient documentation

## 2022-06-02 LAB — COMPREHENSIVE METABOLIC PANEL
ALT: 13 U/L (ref 0–44)
AST: 15 U/L (ref 15–41)
Albumin: 4.6 g/dL (ref 3.5–5.0)
Alkaline Phosphatase: 67 U/L (ref 38–126)
Anion gap: 8 (ref 5–15)
BUN: 17 mg/dL (ref 6–20)
CO2: 27 mmol/L (ref 22–32)
Calcium: 9.8 mg/dL (ref 8.9–10.3)
Chloride: 101 mmol/L (ref 98–111)
Creatinine, Ser: 1.11 mg/dL (ref 0.61–1.24)
GFR, Estimated: >60 mL/min (ref >60)
Glucose, Bld: 108 mg/dL — ABNORMAL HIGH (ref 70–99)
Potassium: 3.7 mmol/L (ref 3.5–5.1)
Sodium: 136 mmol/L (ref 135–145)
Total Bilirubin: 0.7 mg/dL (ref 0.3–1.2)
Total Protein: 7.6 g/dL (ref 6.5–8.1)

## 2022-06-02 LAB — URINALYSIS, ROUTINE W REFLEX MICROSCOPIC
Bilirubin Urine: NEGATIVE
Glucose, UA: NEGATIVE mg/dL
Hgb urine dipstick: NEGATIVE
Ketones, ur: NEGATIVE mg/dL
Leukocytes,Ua: NEGATIVE
Nitrite: NEGATIVE
Protein, ur: NEGATIVE mg/dL
Specific Gravity, Urine: 1.027 (ref 1.005–1.030)
pH: 5.5 (ref 5.0–8.0)

## 2022-06-02 LAB — CBC
HCT: 42.7 % (ref 39.0–52.0)
Hemoglobin: 13.6 g/dL (ref 13.0–17.0)
MCH: 29.1 pg (ref 26.0–34.0)
MCHC: 31.9 g/dL (ref 30.0–36.0)
MCV: 91.4 fL (ref 80.0–100.0)
Platelets: 302 10*3/uL (ref 150–400)
RBC: 4.67 MIL/uL (ref 4.22–5.81)
RDW: 11.9 % (ref 11.5–15.5)
WBC: 13.1 10*3/uL — ABNORMAL HIGH (ref 4.0–10.5)
nRBC: 0 % (ref 0.0–0.2)

## 2022-06-02 LAB — LIPASE, BLOOD: Lipase: 17 U/L (ref 11–51)

## 2022-06-02 MED ORDER — SODIUM CHLORIDE 0.9 % IV BOLUS
1000.0000 mL | Freq: Once | INTRAVENOUS | Status: AC
  Administered 2022-06-02: 1000 mL via INTRAVENOUS

## 2022-06-02 MED ORDER — MAGNESIUM SULFATE IN D5W 1-5 GM/100ML-% IV SOLN
1.0000 g | Freq: Once | INTRAVENOUS | Status: AC
  Administered 2022-06-02: 1 g via INTRAVENOUS
  Filled 2022-06-02: qty 100

## 2022-06-02 MED ORDER — DIPHENHYDRAMINE HCL 25 MG PO CAPS
25.0000 mg | ORAL_CAPSULE | Freq: Once | ORAL | Status: AC
  Administered 2022-06-02: 25 mg via ORAL
  Filled 2022-06-02: qty 1

## 2022-06-02 MED ORDER — METOCLOPRAMIDE HCL 5 MG/ML IJ SOLN
10.0000 mg | Freq: Once | INTRAMUSCULAR | Status: AC
  Administered 2022-06-02: 10 mg via INTRAVENOUS
  Filled 2022-06-02: qty 2

## 2022-06-02 NOTE — ED Triage Notes (Signed)
Pt arrived POV, caox4, ambulatory. Pt states he was working on the computer around approx 1600 when he had sudden onset of headache, dizziness, and nausea. Pt states headache has been constant and is still present but that dizziness and nausea has been intermittent.  Pt took tylenol at 1600 which has not provided any relief.   Pt denies any changes in vision, vomiting, and has no obvious neuro deficit in triage.

## 2022-06-02 NOTE — ED Notes (Signed)
Reviewed AVS/discharge instruction with patient. Time allotted for and all questions answered. Patient is agreeable for d/c and escorted to ed exit by staff.  

## 2022-06-02 NOTE — ED Provider Notes (Signed)
[facility] EMERGENCY DEPT Provider Note   CSN: [id number] Arrival date & time: 06/02/22  1710     History  Chief Complaint  Patient presents with   Dizziness    Khaliel Lenn is a 41 y.o. male.   Dizziness Patient is a 41 year old male who denies any past medical history  He presents emergency room today with complaints of sudden onset of circumferential headache.  He states he felt somewhat lightheaded but did not experience any vertigo.  No blurry vision or double vision.  On further questioning it seems that his headache came on over at least 5 minutes.  He denies any nausea or vomiting.  He did not experience any head trauma.  He is not on any anticoagulation.  He states that he came to the emergency room soon after his symptoms began for evaluation.  Denies any numbness weakness slurred speech or confusion.  No other associate symptoms    Home Medications Prior to Admission medications   Medication Sig Start Date End Date Taking? Authorizing Provider  ibuprofen (ADVIL,MOTRIN) 800 MG tablet Take 1 tablet (800 mg total) every 8 (eight) hours as needed by mouth. 06/06/17   Lawyer, Harrell Gave, PA-C  omeprazole (PRILOSEC) 20 MG capsule Take 1 capsule (20 mg total) by mouth daily. 08/31/16   Leo Grosser, MD  penicillin v potassium (VEETID) 250 MG tablet Take 250 mg by mouth 4 (four) times daily.    [provider]  traMADol (ULTRAM) 50 MG tablet Take 1 tablet (50 mg total) by mouth every 6 (six) hours as needed for pain. 04/25/13   Veryl Speak, MD      Allergies    Patient has no known allergies.    Review of Systems   Review of Systems  Neurological:  Positive for dizziness.    Physical Exam Updated Vital Signs BP 107/77   Pulse 62   Temp 98.2 F (36.8 C) (Oral)   Resp 18   Ht 5\' 11"  (1.803 m)   Wt 90.7 kg   SpO2 98%   BMI 27.89 kg/m  Physical Exam Vitals and nursing note reviewed.  Constitutional:      General: He is not in acute  distress.    Appearance: Normal appearance. He is not ill-appearing.  HENT:     Head: Normocephalic and atraumatic.     Nose: Nose normal.     Mouth/Throat:     Mouth: Mucous membranes are dry.  Eyes:     General: No scleral icterus.       Right eye: No discharge.        Left eye: No discharge.     Conjunctiva/sclera: Conjunctivae normal.  Cardiovascular:     Rate and Rhythm: Normal rate and regular rhythm.     Pulses: Normal pulses.     Heart sounds: Normal heart sounds.  Pulmonary:     Effort: Pulmonary effort is normal. No respiratory distress.     Breath sounds: No stridor. No wheezing.  Abdominal:     Palpations: Abdomen is soft.     Tenderness: There is no abdominal tenderness.  Musculoskeletal:     Cervical back: Normal range of motion.     Right lower leg: No edema.     Left lower leg: No edema.  Skin:    General: Skin is warm and dry.     Capillary Refill: Capillary refill takes less than 2 seconds.  Neurological:     Mental Status: He is alert and oriented to person,  place, and time. Mental status is at baseline.     Comments: Alert and oriented to self, place, time and event.   Speech is fluent, clear without dysarthria or dysphasia.   Strength 5/5 in upper/lower extremities   Sensation intact in upper/lower extremities   Normal gait.  CN I not tested  CN II grossly intact visual fields bilaterally. Did not visualize posterior eye.  CN III, IV, VI PERRLA and EOMs intact bilaterally  CN V Intact sensation to sharp and light touch to the face  CN VII facial movements symmetric  CN VIII not tested  CN IX, X no uvula deviation, symmetric rise of soft palate  CN XI 5/5 SCM and trapezius strength bilaterally  CN XII Midline tongue protrusion, symmetric L/R movements   Psychiatric:        Mood and Affect: Mood normal.        Behavior: Behavior normal.     ED Results / Procedures / Treatments   Labs (all labs ordered are listed, but only abnormal results are  displayed) Labs Reviewed  COMPREHENSIVE METABOLIC PANEL - Abnormal; Notable for the following components:      Result Value   Glucose, Bld 108 (*)    All other components within normal limits  CBC - Abnormal; Notable for the following components:   WBC 13.1 (*)    All other components within normal limits  LIPASE, BLOOD  URINALYSIS, ROUTINE W REFLEX MICROSCOPIC    EKG None  Radiology CT Head Wo Contrast  Result Date: 06/02/2022 CLINICAL DATA:  Sudden severe headache, dizziness and nausea. No focal neurologic deficits. EXAM: CT HEAD WITHOUT CONTRAST TECHNIQUE: Contiguous axial images were obtained from the base of the skull through the vertex without intravenous contrast. RADIATION DOSE REDUCTION: This exam was performed according to the departmental dose-optimization program which includes automated exposure control, adjustment of the mA and/or kV according to patient size and/or use of iterative reconstruction technique. COMPARISON:  None Available. FINDINGS: Brain: No evidence of acute infarction, hemorrhage, hydrocephalus, extra-axial collection or mass lesion/mass effect. There is a partially empty sella. Vascular: No hyperdense vessel or unexpected calcification. Skull: Negative for fractures or focal lesions. Sinuses/Orbits: No acute finding. Other: None. IMPRESSION: 1. No acute intracranial process. 2. Partially empty sella. This is a nonspecific finding but can be seen in the setting of idiopathic intracranial hypertension. Electronically Signed   By: Almira Bar M.D.   On: 06/02/2022 20:02    Procedures Procedures    Medications Ordered in ED Medications  metoCLOPramide (REGLAN) injection 10 mg (10 mg Intravenous Given 06/02/22 1940)  diphenhydrAMINE (BENADRYL) capsule 25 mg (25 mg Oral Given 06/02/22 1943)  sodium chloride 0.9 % bolus 1,000 mL (0 mLs Intravenous Stopped 06/02/22 2111)  magnesium sulfate IVPB 1 g 100 mL (0 g Intravenous Stopped 06/02/22 2012)    ED  Course/ Medical Decision Making/ A&P Clinical Course as of 06/02/22 2209  Tue Jun 02, 2022  1918 4pm onset of sudden headache, LH, no vertigo, no other pain. Nausea but no vomiting.  [WF]  2017 IMPRESSION: 1. No acute intracranial process. 2. Partially empty sella. This is a nonspecific finding but can be seen in the setting of idiopathic intracranial hypertension.   Electronically Signed   By: Almira Bar M.D.   On: 06/02/2022 20:02   [WF]    Clinical Course User Index [WF] Gailen Shelter, PA  Medical Decision Making Amount and/or Complexity of Data Reviewed Labs: ordered. Radiology: ordered.  Risk Prescription drug management.   This patient presents to the ED for concern of headache, this involves a number of treatment options, and is a complaint that carries with it a moderate to high risk of complications and morbidity. A differential diagnosis was considered for the patient's symptoms which is discussed below:   Emergent considerations for headache include subarachnoid hemorrhage, meningitis, temporal arteritis, glaucoma, cerebral ischemia, carotid/vertebral dissection, intracranial tumor, Venous sinus thrombosis, carbon monoxide poisoning, acute or chronic subdural hemorrhage.  Other considerations include: Migraine, Cluster headache, Hypertension, Caffeine, alcohol, or drug withdrawal, Pseudotumor cerebri, Arteriovenous malformation, Head injury, Neurocysticercosis, Post-lumbar puncture, Preeclampsia, Tension headache, Sinusitis, Cervical arthritis, Refractive error causing strain, Dental abscess, Otitis media, Temporomandibular joint syndrome, Depression, Somatoform disorder (eg, somatization) Trigeminal neuralgia, Glossopharyngeal neuralgia.    Co morbidities: Discussed in HPI   Brief History:  Patient is a 41 year old male who denies any past medical history  He presents emergency room today with complaints of sudden onset of  circumferential headache.  He states he felt somewhat lightheaded but did not experience any vertigo.  No blurry vision or double vision.  On further questioning it seems that his headache came on over at least 5 minutes.  He denies any nausea or vomiting.  He did not experience any head trauma.  He is not on any anticoagulation.  He states that he came to the emergency room soon after his symptoms began for evaluation.  Denies any numbness weakness slurred speech or confusion.  No other associate symptoms    EMR reviewed including pt PMHx, past surgical history and past visits to ER.   See HPI for more details   Lab Tests:   I ordered and independently interpreted labs. Labs notable for mild nonspecific leukocytosis of 13.1.  Lipase within normal limits.  No anemia.  CMP unremarkable urinalysis unremarkable   Imaging Studies:  NAD. I personally reviewed all imaging studies and no acute abnormality found. I agree with radiology interpretation. Apart from empty sella no acute abnormal findings.  Patient updated on this incidental finding   Cardiac Monitoring:  NA NA   Medicines ordered:  I ordered medication including 1 L normal saline, magnesium, Benadryl, Reglan for headache Reevaluation of the patient after these medicines showed that the patient resolved I have reviewed the patients home medicines and have made adjustments as needed   Critical Interventions:     Consults/Attending Physician      Reevaluation:  After the interventions noted above I re-evaluated patient and found that they have :resolved   Social Determinants of Health:      Problem List / ED Course:  Headache -now resolved.  Patient feels much improved.  We will follow-up with PCP return precautions discussed   Dispostion:  After consideration of the diagnostic results and the patients response to treatment, I feel that the patent would benefit from discharge w pcp FU   Final  Clinical Impression(s) / ED Diagnoses Final diagnoses:  Acute nonintractable headache, unspecified headache type    Rx / DC Orders ED Discharge Orders     None         Tedd Sias, [place] 06/02/22 2211    Cristie Hem, MD 06/03/22 1145

## 2022-06-02 NOTE — Discharge Instructions (Signed)
I am glad that your headache has completely resolved.  Please make sure you are hydrating, be consistent with your caffeine intake.  Refrain from any recreational drug use or alcohol use.  Follow-up with a primary care provider.

## 2022-06-23 ENCOUNTER — Other Ambulatory Visit: Payer: Self-pay

## 2022-06-23 ENCOUNTER — Other Ambulatory Visit (HOSPITAL_BASED_OUTPATIENT_CLINIC_OR_DEPARTMENT_OTHER): Payer: Self-pay

## 2022-06-23 ENCOUNTER — Emergency Department (HOSPITAL_BASED_OUTPATIENT_CLINIC_OR_DEPARTMENT_OTHER): Payer: No Typology Code available for payment source

## 2022-06-23 ENCOUNTER — Emergency Department (HOSPITAL_BASED_OUTPATIENT_CLINIC_OR_DEPARTMENT_OTHER)
Admission: EM | Admit: 2022-06-23 | Discharge: 2022-06-23 | Disposition: A | Discharge status: Home / Self Care | Payer: No Typology Code available for payment source | Attending: Emergency Medicine | Admitting: Emergency Medicine

## 2022-06-23 ENCOUNTER — Encounter (HOSPITAL_BASED_OUTPATIENT_CLINIC_OR_DEPARTMENT_OTHER): Payer: Self-pay

## 2022-06-23 DIAGNOSIS — Z1152 Encounter for screening for COVID-19: Secondary | ICD-10-CM | POA: Insufficient documentation

## 2022-06-23 DIAGNOSIS — R109 Unspecified abdominal pain: Secondary | ICD-10-CM | POA: Insufficient documentation

## 2022-06-23 LAB — CBC
HCT: 42.6 % (ref 39.0–52.0)
Hemoglobin: 13.9 g/dL (ref 13.0–17.0)
MCH: 29.1 pg (ref 26.0–34.0)
MCHC: 32.6 g/dL (ref 30.0–36.0)
MCV: 89.3 fL (ref 80.0–100.0)
Platelets: 325 10*3/uL (ref 150–400)
RBC: 4.77 MIL/uL (ref 4.22–5.81)
RDW: 11.6 % (ref 11.5–15.5)
WBC: 13.7 10*3/uL — ABNORMAL HIGH (ref 4.0–10.5)
nRBC: 0 % (ref 0.0–0.2)

## 2022-06-23 LAB — COMPREHENSIVE METABOLIC PANEL
ALT: 20 U/L (ref 0–44)
AST: 21 U/L (ref 15–41)
Albumin: 4.3 g/dL (ref 3.5–5.0)
Alkaline Phosphatase: 74 U/L (ref 38–126)
Anion gap: 9 (ref 5–15)
BUN: 16 mg/dL (ref 6–20)
CO2: 25 mmol/L (ref 22–32)
Calcium: 9.5 mg/dL (ref 8.9–10.3)
Chloride: 106 mmol/L (ref 98–111)
Creatinine, Ser: 1.04 mg/dL (ref 0.61–1.24)
GFR, Estimated: >60 mL/min (ref >60)
Glucose, Bld: 129 mg/dL — ABNORMAL HIGH (ref 70–99)
Potassium: 3.6 mmol/L (ref 3.5–5.1)
Sodium: 140 mmol/L (ref 135–145)
Total Bilirubin: 1.1 mg/dL (ref 0.3–1.2)
Total Protein: 7.7 g/dL (ref 6.5–8.1)

## 2022-06-23 LAB — URINALYSIS, ROUTINE W REFLEX MICROSCOPIC
Bilirubin Urine: NEGATIVE
Glucose, UA: NEGATIVE mg/dL
Hgb urine dipstick: NEGATIVE
Ketones, ur: NEGATIVE mg/dL
Leukocytes,Ua: NEGATIVE
Nitrite: NEGATIVE
Protein, ur: NEGATIVE mg/dL
Specific Gravity, Urine: 1.025 (ref 1.005–1.030)
pH: 7 (ref 5.0–8.0)

## 2022-06-23 LAB — LIPASE, BLOOD: Lipase: 28 U/L (ref 11–51)

## 2022-06-23 LAB — RESP PANEL BY RT-PCR (FLU A&B, COVID) ARPGX2
Influenza A by PCR: NEGATIVE
Influenza B by PCR: NEGATIVE
SARS Coronavirus 2 by RT PCR: NEGATIVE

## 2022-06-23 MED ORDER — CIPROFLOXACIN HCL 500 MG PO TABS
500.0000 mg | ORAL_TABLET | Freq: Two times a day (BID) | ORAL | 0 refills | Status: DC
  Filled 2022-06-23: qty 14, 7d supply, fill #0

## 2022-06-23 MED ORDER — OXYCODONE HCL 5 MG PO TABS
5.0000 mg | ORAL_TABLET | Freq: Four times a day (QID) | ORAL | 0 refills | Status: DC | PRN
  Filled 2022-06-23: qty 10, 3d supply, fill #0

## 2022-06-23 MED ORDER — FENTANYL CITRATE PF 50 MCG/ML IJ SOSY
50.0000 ug | PREFILLED_SYRINGE | Freq: Once | INTRAMUSCULAR | Status: AC
  Administered 2022-06-23: 50 ug via INTRAVENOUS
  Filled 2022-06-23: qty 1

## 2022-06-23 MED ORDER — ONDANSETRON 4 MG PO TBDP
4.0000 mg | ORAL_TABLET | Freq: Three times a day (TID) | ORAL | 0 refills | Status: DC | PRN
  Filled 2022-06-23: qty 20, 7d supply, fill #0

## 2022-06-23 MED ORDER — OXYCODONE HCL 5 MG PO TABS: 5.0000 mg | ORAL_TABLET | Freq: Four times a day (QID) | ORAL | 0 refills | Status: AC | PRN

## 2022-06-23 MED ORDER — CIPROFLOXACIN HCL 500 MG PO TABS: 500.0000 mg | ORAL_TABLET | Freq: Two times a day (BID) | ORAL | 0 refills | Status: AC

## 2022-06-23 MED ORDER — ONDANSETRON 4 MG PO TBDP: 4.0000 mg | ORAL_TABLET | Freq: Three times a day (TID) | ORAL | 0 refills | Status: AC | PRN

## 2022-06-23 MED ORDER — SODIUM CHLORIDE 0.9 % IV BOLUS
1000.0000 mL | Freq: Once | INTRAVENOUS | Status: AC
  Administered 2022-06-23: 1000 mL via INTRAVENOUS

## 2022-06-23 MED ORDER — ONDANSETRON HCL 4 MG/2ML IJ SOLN
4.0000 mg | Freq: Once | INTRAMUSCULAR | Status: AC
  Administered 2022-06-23: 4 mg via INTRAVENOUS
  Filled 2022-06-23: qty 2

## 2022-06-23 MED ORDER — METRONIDAZOLE 500 MG PO TABS: 500.0000 mg | ORAL_TABLET | Freq: Three times a day (TID) | ORAL | 0 refills | Status: AC

## 2022-06-23 MED ORDER — ACETAMINOPHEN 325 MG PO TABS
650.0000 mg | ORAL_TABLET | Freq: Once | ORAL | Status: AC
  Administered 2022-06-23: 650 mg via ORAL
  Filled 2022-06-23: qty 2

## 2022-06-23 MED ORDER — METRONIDAZOLE 500 MG PO TABS
500.0000 mg | ORAL_TABLET | Freq: Three times a day (TID) | ORAL | 0 refills | Status: DC
  Filled 2022-06-23: qty 21, 7d supply, fill #0

## 2022-06-23 NOTE — Discharge Instructions (Signed)
Overall we will treat you for possible inflammation in your rectal area.  Take antibiotics as prescribed.  Take Zofran as needed for nausea or vomiting.  I have prescribed you narcotic pain medicine for any breakthrough pain.  Recommend Tylenol and ibuprofen as well.  Follow-up with primary care doctor and GI doctor as well.

## 2022-06-23 NOTE — ED Provider Notes (Signed)
[facility] EMERGENCY DEPARTMENT Provider Note   CSN: [id number] Arrival date & time: 06/23/22  7902     History  Chief Complaint  Patient presents with   Abdominal Pain    Frank Haynes is a 41 y.o. male.  Patient here with left-sided abdominal pain since this morning.  Has had nausea.  Denies history of kidney stones or pain with urination.  History of acid reflux.  Pain mostly left-sided/left flank.  Started this morning.  Nothing makes it worse or better.  He states that his son had viral symptoms recently.  Denies any diarrhea or vomiting but feels nauseous.  Nothing makes it worse or better.  Denies any abdominal surgery history.  The history is provided by the patient.       Home Medications Prior to Admission medications   Medication Sig Start Date End Date Taking? Authorizing Provider  ciprofloxacin (CIPRO) 500 MG tablet Take 1 tablet (500 mg total) by mouth every 12 (twelve) hours for 7 days. 06/23/22 06/30/22 Yes Thomson Herbers, DO  metroNIDAZOLE (FLAGYL) 500 MG tablet Take 1 tablet (500 mg total) by mouth 3 (three) times daily for 7 days. 06/23/22 06/30/22 Yes Yardley Lekas, DO  ondansetron (ZOFRAN-ODT) 4 MG disintegrating tablet Take 1 tablet (4 mg total) by mouth every 8 (eight) hours as needed for nausea or vomiting. 06/23/22  Yes Micaella Gitto, DO  oxyCODONE (ROXICODONE) 5 MG immediate release tablet Take 1 tablet (5 mg total) by mouth every 6 (six) hours as needed for up to 10 doses for breakthrough pain. 06/23/22  Yes Brownie Gockel, DO  ibuprofen (ADVIL,MOTRIN) 800 MG tablet Take 1 tablet (800 mg total) every 8 (eight) hours as needed by mouth. 06/06/17   Lawyer, Cristal Deer, PA-C  omeprazole (PRILOSEC) 20 MG capsule Take 1 capsule (20 mg total) by mouth daily. 08/31/16   Lyndal Pulley, MD  penicillin v potassium (VEETID) 250 MG tablet Take 250 mg by mouth 4 (four) times daily.    [provider]  traMADol (ULTRAM) 50 MG tablet Take 1 tablet (50 mg  total) by mouth every 6 (six) hours as needed for pain. 04/25/13   Geoffery Lyons, MD      Allergies    Patient has no known allergies.    Review of Systems   Review of Systems  Physical Exam Updated Vital Signs BP 132/82 (BP Location: Right Arm)   Pulse 61   Temp 97.7 F (36.5 C) (Oral)   Resp 16   Ht 5\' 11"  (1.803 m)   Wt 89.8 kg   SpO2 100%   BMI 27.62 kg/m  Physical Exam Vitals and nursing note reviewed.  Constitutional:      General: He is not in acute distress.    Appearance: He is well-developed. He is not ill-appearing.  HENT:     Head: Normocephalic and atraumatic.     Mouth/Throat:     Mouth: Mucous membranes are moist.  Eyes:     Extraocular Movements: Extraocular movements intact.     Conjunctiva/sclera: Conjunctivae normal.     Pupils: Pupils are equal, round, and reactive to light.  Cardiovascular:     Rate and Rhythm: Normal rate and regular rhythm.     Heart sounds: Normal heart sounds. No murmur heard. Pulmonary:     Effort: Pulmonary effort is normal. No respiratory distress.     Breath sounds: Normal breath sounds.  Abdominal:     Palpations: Abdomen is soft.     Tenderness: There is abdominal  tenderness in the left upper quadrant and left lower quadrant.     Hernia: No hernia is present.  Musculoskeletal:        General: No swelling.     Cervical back: Neck supple.  Skin:    General: Skin is warm and dry.     Capillary Refill: Capillary refill takes less than 2 seconds.  Neurological:     General: No focal deficit present.     Mental Status: He is alert.  Psychiatric:        Mood and Affect: Mood normal.     ED Results / Procedures / Treatments   Labs (all labs ordered are listed, but only abnormal results are displayed) Labs Reviewed  COMPREHENSIVE METABOLIC PANEL - Abnormal; Notable for the following components:      Result Value   Glucose, Bld 129 (*)    All other components within normal limits  CBC - Abnormal; Notable for the  following components:   WBC 13.7 (*)    All other components within normal limits  RESP PANEL BY RT-PCR (FLU A&B, COVID) ARPGX2  LIPASE, BLOOD  URINALYSIS, ROUTINE W REFLEX MICROSCOPIC    EKG None  Radiology CT Renal Stone Study  Result Date: 06/23/2022 CLINICAL DATA:  Lower abdominal pain since last week, renal stone suspected. EXAM: CT ABDOMEN AND PELVIS WITHOUT CONTRAST TECHNIQUE: Multidetector CT imaging of the abdomen and pelvis was performed following the standard protocol without IV contrast. RADIATION DOSE REDUCTION: This exam was performed according to the departmental dose-optimization program which includes automated exposure control, adjustment of the mA and/or kV according to patient size and/or use of iterative reconstruction technique. COMPARISON:  None Available. FINDINGS: Motion degraded examination which limits specificity and sensitivity. Lower chest: No acute abnormality. Hepatobiliary: Unremarkable noncontrast enhanced appearance of the hepatic parenchyma. Gallbladder is unremarkable. No biliary ductal dilation. Pancreas: No pancreatic ductal dilation or evidence of acute inflammation. Spleen: No splenomegaly. Adrenals/Urinary Tract: Bilateral adrenal glands appear normal. No hydronephrosis. Nonobstructive left-sided renal stones measure up to 2-3 mm. No obstructive ureteral or bladder calculi identified. Urinary bladder is unremarkable for degree of distension. Stomach/Bowel: Stomach is distended with ingested material and gas without focal wall thickening. No pathologic dilation of large or small bowel. Appendix is not confidently identified however there is no pericecal inflammation. Low-density wall thickening of nondistended mid/distal rectum. Vascular/Lymphatic: Normal caliber abdominal aorta. No pathologically enlarged abdominal or pelvic lymph nodes. Reproductive: Prostate is unremarkable. Other: Fluid stranding about a small fat containing supraumbilical ventral hernia.  Musculoskeletal: No acute osseous abnormality. Degenerative changes bilateral hips. IMPRESSION: Motion degraded examination which limits specificity and sensitivity. Within this context: 1. Low-density wall thickening of nondistended mid/distal rectum, wall thickening is favored secondary to underdistention however suggest clinical correlation for proctitis. 2. Nonobstructive left-sided renal stones measure up to 2-3 mm. No obstructive ureteral or bladder calculi identified. 3. Inflammatory stranding about a small fat containing supraumbilical ventral hernia, suggest clinical correlation for reducibility. Electronically Signed   By: Maudry Mayhew M.D.   On: 06/23/2022 10:06    Procedures Procedures    Medications Ordered in ED Medications  sodium chloride 0.9 % bolus 1,000 mL (1,000 mLs Intravenous New Bag/Given 06/23/22 0957)  ondansetron (ZOFRAN) injection 4 mg (4 mg Intravenous Given 06/23/22 1003)  acetaminophen (TYLENOL) tablet 650 mg (650 mg Oral Given 06/23/22 1004)  fentaNYL (SUBLIMAZE) injection 50 mcg (50 mcg Intravenous Given 06/23/22 1022)    ED Course/ Medical Decision Making/ A&P  Medical Decision Making Amount and/or Complexity of Data Reviewed Labs: ordered. Radiology: ordered.  Risk OTC drugs. Prescription drug management.   Holley Wirt is here with left-sided abdominal pain.  Normal vitals.  No fever.  No significant medical history.  Differential diagnosis is kidney stone versus colitis versus gastritis versus diverticulitis versus less likely bowel obstruction.  Will get a CT scan abdomen pelvis.  Will get CBC, CMP, lipase, urinalysis.  Son recently with some viral symptoms and this could be viral process we will check for COVID and flu.  Will give IV fluids, IV Zofran, Tylenol and reevaluate.  Per my review and interpretation of labs there is no UTI.  No significant anemia or electrolyte abnormality or leukocytosis.  Gallbladder and liver  enzymes within normal limits.  Lipase is normal without pancreatitis.  Will get a CT scan to further evaluate.  Per my review of viral swab unremarkable.  Negative for flu and COVID.  CT scan per radiology report shows may be some thickening of the mid to distal rectum.  May be proctitis.  No obstructive kidney stones.  Patient has a fat-containing hernia.  Overall we will treat for possible inflammatory/infectious process in the bowel.  Not having any obvious rectal discomfort.  Will treat with antibiotics have him follow-up with GI as he has had some issues like this in the past.  Somatic management with Roxicodone and Zofran provided as well.  This chart was dictated using voice recognition software.  Despite best efforts to proofread,  errors can occur which can change the documentation meaning.         Final Clinical Impression(s) / ED Diagnoses Final diagnoses:  Abdominal pain, unspecified abdominal location    Rx / DC Orders ED Discharge Orders          Ordered    ciprofloxacin (CIPRO) 500 MG tablet  Every 12 hours        06/23/22 1044    metroNIDAZOLE (FLAGYL) 500 MG tablet  3 times daily        06/23/22 1044    oxyCODONE (ROXICODONE) 5 MG immediate release tablet  Every 6 hours PRN        06/23/22 1044    ondansetron (ZOFRAN-ODT) 4 MG disintegrating tablet  Every 8 hours PRN        06/23/22 1044              Undrea Shipes, DO 06/23/22 1047

## 2022-06-23 NOTE — ED Triage Notes (Signed)
C/o abdominal pain since last week, worsened today. Some nausea, denies vomiting/diarrhea.

## 2022-07-28 ENCOUNTER — Ambulatory Visit: Payer: No Typology Code available for payment source | Admitting: Physician Assistant

## 2022-07-28 ENCOUNTER — Encounter: Payer: Self-pay | Admitting: Physician Assistant

## 2022-07-28 VITALS — BP 110/82 | HR 85 | Ht 71.0 in | Wt 193.2 lb

## 2022-07-28 DIAGNOSIS — R194 Change in bowel habit: Secondary | ICD-10-CM

## 2022-07-28 DIAGNOSIS — R1084 Generalized abdominal pain: Secondary | ICD-10-CM | POA: Diagnosis not present

## 2022-07-28 DIAGNOSIS — R933 Abnormal findings on diagnostic imaging of other parts of digestive tract: Secondary | ICD-10-CM | POA: Diagnosis not present

## 2022-07-28 DIAGNOSIS — K6289 Other specified diseases of anus and rectum: Secondary | ICD-10-CM | POA: Diagnosis not present

## 2022-07-28 NOTE — Progress Notes (Signed)
Chief Complaint: Abdominal pain  HPI:    Frank Haynes is a 42 year old male with a past medical history as listed below including reflux, who presents to clinic today with a complaint of abdominal pain.    06/23/2022 patient seen in the ER for abdominal pain, discussed left-sided abdominal pain with nausea mostly in his flank.  Labs with a normal CMP and a CBC with a minimally elevated white count at 13.7, normal lipase and urinalysis.  CT renal stone study for lower abdominal pain was motion degraded with low-density wall thickening of the nondistended mid/distal rectum wall thickening favored secondary to underdistention however could correlate with proctitis, nonobstructive left-sided renal stones measuring up to 2-3 mm, no obstruction, inflammatory stranding about a small fat containing supraumbilical ventral hernia suggest clinical correlation for reducibility.  Patient treated with Cipro and Flagyl, Zofran and oxycodone.    Today, the patient tells me that in late November he developed discomfort in his upper abdomen which continued and then he ate some spaghetti which is "against my diet" because he has a history of reflux and developed unbearable pain and proceeded to the ER on 5th of December as above.  Describes that since that visit he has noticed that his bowel movements have changed to constipation and every time he passes a stool it is hard to press out and he sees some bright red blood mixed in with it.  Along with this has a generalized abdominal pain.  He has tried to increase greens in his diet and his stools are slightly less hard.  Continues to describe some discomfort when passing stools/pain near his rectum.  Currently his GERD is under control as long as he watches his diet.    Denies family history of IBD.    Denies fever, chills, weight loss, nausea, vomiting or symptoms that awaken him from sleep.  Past Medical History:  Diagnosis Date   GERD (gastroesophageal reflux disease)      Past Surgical History:  Procedure Laterality Date   TOOTH EXTRACTION      Current Outpatient Medications  Medication Sig Dispense Refill   ibuprofen (ADVIL,MOTRIN) 800 MG tablet Take 1 tablet (800 mg total) every 8 (eight) hours as needed by mouth. 21 tablet 0   omeprazole (PRILOSEC) 20 MG capsule Take 1 capsule (20 mg total) by mouth daily. 30 capsule 0   ondansetron (ZOFRAN-ODT) 4 MG disintegrating tablet Take 1 tablet (4 mg total) by mouth every 8 (eight) hours as needed for nausea or vomiting. 20 tablet 0   oxyCODONE (ROXICODONE) 5 MG immediate release tablet Take 1 tablet (5 mg total) by mouth every 6 (six) hours as needed for up to 10 doses for breakthrough pain. 10 tablet 0   penicillin v potassium (VEETID) 250 MG tablet Take 250 mg by mouth 4 (four) times daily.     traMADol (ULTRAM) 50 MG tablet Take 1 tablet (50 mg total) by mouth every 6 (six) hours as needed for pain. 15 tablet 0   No current facility-administered medications for this visit.    Allergies as of 07/28/2022   (No Known Allergies)    No family history on file.  Social History   Socioeconomic History   Marital status: Single    Spouse name: Not on file   Number of children: Not on file   Years of education: Not on file   Highest education level: Not on file  Occupational History   Not on file  Tobacco Use  Smoking status: Never   Smokeless tobacco: Never  Vaping Use   Vaping Use: Every day  Substance and Sexual Activity   Alcohol use: Yes    Comment: occ   Drug use: Yes    Types: Marijuana   Sexual activity: Not on file  Other Topics Concern   Not on file  Social History Narrative   Not on file   Social Determinants of Health   Financial Resource Strain: Not on file  Food Insecurity: Not on file  Transportation Needs: Not on file  Physical Activity: Not on file  Stress: Not on file  Social Connections: Not on file  Intimate Partner Violence: Not on file    Review of Systems:     Constitutional: No weight loss, fever or chills Skin: No rash  Cardiovascular: No chest pain Respiratory: No SOB Gastrointestinal: See HPI and otherwise negative Genitourinary: No dysuria Neurological: No headache, dizziness or syncope Musculoskeletal: No new muscle or joint pain Hematologic: No bruising Psychiatric: No history of depression or anxiety   Physical Exam:  Vital signs: BP 110/82 (BP Location: Right Arm, Patient Position: Sitting)   Pulse 85   Ht 5\' 11"  (1.803 m)   Wt 193 lb 3.2 oz (87.6 kg)   SpO2 97%   BMI 26.95 kg/m   Constitutional:   Pleasant AA male appears to be in NAD, Well developed, Well nourished, alert and cooperative Head:  Normocephalic and atraumatic. Eyes:   PEERL, EOMI. No icterus. Conjunctiva pink. Ears:  Normal auditory acuity. Neck:  Supple Throat: Oral cavity and pharynx without inflammation, swelling or lesion.  Respiratory: Respirations even and unlabored. Lungs clear to auscultation bilaterally.   No wheezes, crackles, or rhonchi.  Cardiovascular: Normal S1, S2. No MRG. Regular rate and rhythm. No peripheral edema, cyanosis or pallor.  Gastrointestinal:  Soft, nondistended, mild generalized ttp, ventral hernia. No rebound or guarding. Normal bowel sounds. No appreciable masses or hepatomegaly. Rectal:  Not performed.  Msk:  Symmetrical without gross deformities. Without edema, no deformity or joint abnormality.  Neurologic:  Alert and  oriented x4;  grossly normal neurologically.  Skin:   Dry and intact without significant lesions or rashes. Psychiatric: Demonstrates good judgement and reason without abnormal affect or behaviors.  RELEVANT LABS AND IMAGING: CBC    Component Value Date/Time   WBC 13.7 (H) 06/23/2022 0900   RBC 4.77 06/23/2022 0900   HGB 13.9 06/23/2022 0900   HCT 42.6 06/23/2022 0900   PLT 325 06/23/2022 0900   MCV 89.3 06/23/2022 0900   MCH 29.1 06/23/2022 0900   MCHC 32.6 06/23/2022 0900   RDW 11.6 06/23/2022  0900   LYMPHSABS 2.8 06/06/2017 1229   MONOABS 0.6 06/06/2017 1229   EOSABS 0.1 06/06/2017 1229   BASOSABS 0.0 06/06/2017 1229    CMP     Component Value Date/Time   NA 140 06/23/2022 0900   K 3.6 06/23/2022 0900   CL 106 06/23/2022 0900   CO2 25 06/23/2022 0900   GLUCOSE 129 (H) 06/23/2022 0900   BUN 16 06/23/2022 0900   CREATININE 1.04 06/23/2022 0900   CALCIUM 9.5 06/23/2022 0900   PROT 7.7 06/23/2022 0900   ALBUMIN 4.3 06/23/2022 0900   AST 21 06/23/2022 0900   ALT 20 06/23/2022 0900   ALKPHOS 74 06/23/2022 0900   BILITOT 1.1 06/23/2022 0900   GFRNONAA >60 06/23/2022 0900   GFRAA >60 06/06/2017 1229    Assessment: 1.  Generalized abdominal pain: Worse over the past month, consider relation  to below possible IBD versus IBS versus other 2.  Rectal bleeding: Consider relation of possible proctitis versus hemorrhoids versus other 3.  Constipation: Changed over the past month or so, no real change in diet; consider relation to below versus other 4.  Abnormal CT of the abdomen/rectum: Showing question of proctitis, patient describes rectal bleeding and a change to constipation with some discomfort; consider proctitis versus other  Plan: 1.  Scheduled patient for diagnostic colonoscopy in the [place] with Dr. Lorenso Courier.  Did provide the patient a detailed list of risks for the procedure and he agrees to proceed. Patient is appropriate for endoscopic procedure(s) in the ambulatory ([place]) setting.  2.  Recommend the patient start MiraLAX once daily for his constipation 3.  Reviewed antireflux diet and lifestyle modifications. 4.  Patient to follow in clinic per recommendations after time of colonoscopy.  Ellouise Newer, PA-C [facility] 07/28/2022, 8:46 AM

## 2022-07-28 NOTE — Patient Instructions (Signed)
_______________________________________________________  If you are age 40 or older, your body mass index should be between 23-30. Your Body mass index is 26.95 kg/m. If this is out of the aforementioned range listed, please consider follow up with your Primary Care Provider.  If you are age 39 or younger, your body mass index should be between 19-25. Your Body mass index is 26.95 kg/m. If this is out of the aformentioned range listed, please consider follow up with your Primary Care Provider.   You have been scheduled for a colonoscopy. Please follow written instructions given to you at your visit today.  Please pick up your prep supplies at the pharmacy within the next 1-3 days. If you use inhalers (even only as needed), please bring them with you on the day of your procedure.  Start Miralax daily.  The [place] [facility] providers would like to encourage you to use [facility] to communicate with providers for non-urgent requests or questions.  Due to long hold times on the telephone, sending your provider a message by [facility] may be a faster and more efficient way to get a response.  Please allow 48 business hours for a response.  Please remember that this is for non-urgent requests.   It was a pleasure to see you today!  Thank you for trusting me with your gastrointestinal care!

## 2022-07-28 NOTE — Progress Notes (Signed)
I agree with the assessment and plan as outlined by Ms. Lemmon. 

## 2022-09-22 ENCOUNTER — Encounter: Payer: Self-pay | Admitting: Internal Medicine

## 2022-09-30 ENCOUNTER — Ambulatory Visit: Payer: No Typology Code available for payment source | Admitting: Internal Medicine

## 2022-09-30 ENCOUNTER — Encounter: Payer: Self-pay | Admitting: Internal Medicine

## 2022-09-30 VITALS — BP 105/73 | HR 64 | Temp 99.1°F | Resp 11 | Ht 71.0 in | Wt 193.0 lb

## 2022-09-30 DIAGNOSIS — D120 Benign neoplasm of cecum: Secondary | ICD-10-CM

## 2022-09-30 DIAGNOSIS — K6289 Other specified diseases of anus and rectum: Secondary | ICD-10-CM

## 2022-09-30 MED ORDER — AMBULATORY NON FORMULARY MEDICATION: 1 refills | Status: AC

## 2022-09-30 MED ORDER — SODIUM CHLORIDE 0.9 % IV SOLN: 500.0000 mL | Freq: Once | INTRAVENOUS | Status: DC

## 2022-09-30 NOTE — Progress Notes (Signed)
Called to room to assist during endoscopic procedure.  Patient ID and intended procedure confirmed with present staff. Received instructions for my participation in the procedure from the performing physician.  

## 2022-09-30 NOTE — Progress Notes (Signed)
Pt's states no medical or surgical changes since previsit or office visit. 

## 2022-09-30 NOTE — Progress Notes (Signed)
GASTROENTEROLOGY PROCEDURE H&P NOTE   Primary Care Physician: Patient, No Pcp Per    Reason for Procedure:   Rectal bleeding, abnormal CT of rectum  Plan:    Colonoscopy  Patient is appropriate for endoscopic procedure(s) in the ambulatory ([place]) setting.  The nature of the procedure, as well as the risks, benefits, and alternatives were carefully and thoroughly reviewed with the patient. Ample time for discussion and questions allowed. The patient understood, was satisfied, and agreed to proceed.     HPI: Frank Haynes is a 42 y.o. male who presents for colonoscopy for evaluation of rectal bleeding and abnormal CT scan .  Patient was most recently seen in the Gastroenterology Clinic on 07/28/22.  No interval change in medical history since that appointment. Please refer to that note for full details regarding GI history and clinical presentation.   Past Medical History:  Diagnosis Date   GERD (gastroesophageal reflux disease)     Past Surgical History:  Procedure Laterality Date   TOOTH EXTRACTION      Prior to Admission medications   Medication Sig Start Date End Date Taking? Authorizing Provider  acetaminophen (TYLENOL) 500 MG tablet Take 500 mg by mouth every 6 (six) hours as needed for moderate pain or mild pain.    [provider]  ibuprofen (ADVIL,MOTRIN) 800 MG tablet Take 1 tablet (800 mg total) every 8 (eight) hours as needed by mouth. Patient not taking: Reported on 07/28/2022 06/06/17   Dalia Heading, PA-C  Multiple Vitamins-Minerals (MENS MULTIVITAMIN) TABS Take 1 tablet by mouth daily. Patient not taking: Reported on 09/30/2022    [provider]  omeprazole (PRILOSEC) 20 MG capsule Take 1 capsule (20 mg total) by mouth daily. Patient not taking: Reported on 07/28/2022 08/31/16   Leo Grosser, MD  ondansetron (ZOFRAN-ODT) 4 MG disintegrating tablet Take 1 tablet (4 mg total) by mouth every 8 (eight) hours as needed for nausea or  vomiting. Patient not taking: Reported on 07/28/2022 06/23/22   Lennice Sites, DO  oxyCODONE (ROXICODONE) 5 MG immediate release tablet Take 1 tablet (5 mg total) by mouth every 6 (six) hours as needed for up to 10 doses for breakthrough pain. Patient not taking: Reported on 07/28/2022 06/23/22   Lennice Sites, DO  penicillin v potassium (VEETID) 250 MG tablet Take 250 mg by mouth 4 (four) times daily. Patient not taking: Reported on 07/28/2022    [provider]  traMADol (ULTRAM) 50 MG tablet Take 1 tablet (50 mg total) by mouth every 6 (six) hours as needed for pain. Patient not taking: Reported on 07/28/2022 04/25/13   Veryl Speak, MD    Current Outpatient Medications  Medication Sig Dispense Refill   acetaminophen (TYLENOL) 500 MG tablet Take 500 mg by mouth every 6 (six) hours as needed for moderate pain or mild pain.     ibuprofen (ADVIL,MOTRIN) 800 MG tablet Take 1 tablet (800 mg total) every 8 (eight) hours as needed by mouth. (Patient not taking: Reported on 07/28/2022) 21 tablet 0   Multiple Vitamins-Minerals (MENS MULTIVITAMIN) TABS Take 1 tablet by mouth daily. (Patient not taking: Reported on 09/30/2022)     omeprazole (PRILOSEC) 20 MG capsule Take 1 capsule (20 mg total) by mouth daily. (Patient not taking: Reported on 07/28/2022) 30 capsule 0   ondansetron (ZOFRAN-ODT) 4 MG disintegrating tablet Take 1 tablet (4 mg total) by mouth every 8 (eight) hours as needed for nausea or vomiting. (Patient not taking: Reported on 07/28/2022) 20 tablet 0  oxyCODONE (ROXICODONE) 5 MG immediate release tablet Take 1 tablet (5 mg total) by mouth every 6 (six) hours as needed for up to 10 doses for breakthrough pain. (Patient not taking: Reported on 07/28/2022) 10 tablet 0   penicillin v potassium (VEETID) 250 MG tablet Take 250 mg by mouth 4 (four) times daily. (Patient not taking: Reported on 07/28/2022)     traMADol (ULTRAM) 50 MG tablet Take 1 tablet (50 mg total) by mouth every 6 (six) hours as needed  for pain. (Patient not taking: Reported on 07/28/2022) 15 tablet 0   Current Facility-Administered Medications  Medication Dose Route Frequency Provider Last Rate Last Admin   0.9 %  sodium chloride infusion  500 mL Intravenous Once Sharyn Creamer, MD        Allergies as of 09/30/2022   (No Known Allergies)    Family History  Problem Relation Age of Onset   Colon cancer Neg Hx    Esophageal cancer Neg Hx    Rectal cancer Neg Hx    Stomach cancer Neg Hx     Social History   Socioeconomic History   Marital status: Single    Spouse name: Not on file   Number of children: 4   Years of education: Not on file   Highest education level: Not on file  Occupational History   Occupation: customer service [employer]  Tobacco Use   Smoking status: Never   Smokeless tobacco: Never  Vaping Use   Vaping Use: Former  Substance and Sexual Activity   Alcohol use: Yes    Comment: wine once a month   Drug use: Not Currently   Sexual activity: Yes  Other Topics Concern   Not on file  Social History Narrative    4 children - 2 boys, 2 girls   2 boys live at home with patient   No caffeine   Exercise 4-5x a week    Social Determinants of Radio broadcast assistant Strain: Not on file  Food Insecurity: Not on file  Transportation Needs: Not on file  Physical Activity: Not on file  Stress: Not on file  Social Connections: Not on file  Intimate Partner Violence: Not on file    Physical Exam: Vital signs in last 24 hours: BP 114/63   Pulse 73   Temp 99.1 F (37.3 C) (Temporal)   Ht '5\' 11"'$  (1.803 m)   Wt 193 lb (87.5 kg)   SpO2 96%   BMI 26.92 kg/m  GEN: NAD EYE: Sclerae anicteric ENT: MMM CV: Non-tachycardic Pulm: No increased WOB GI: Soft NEURO:  Alert & Oriented   Christia Reading, MD [facility]   09/30/2022 11:12 AM

## 2022-09-30 NOTE — Progress Notes (Signed)
Report to PACU, RN, vss, BBS= Clear.  

## 2022-09-30 NOTE — Op Note (Signed)
[facility] Patient Name: Frank Haynes Procedure Date: 09/30/2022 11:11 AM MRN: [id number] Endoscopist: Georgian Co , , [id number] Age: 42 Referring MD:  Date of Birth: [birth year] Gender: Male Account #: [id number] Procedure:                Colonoscopy Indications:              Rectal bleeding, Abnormal CT of the GI tract,                            Rectal pain Medicines:                Monitored Anesthesia Care Procedure:                Pre-Anesthesia Assessment:                           - Prior to the procedure, a History and Physical                            was performed, and patient medications and                            allergies were reviewed. The patient's tolerance of                            previous anesthesia was also reviewed. The risks                            and benefits of the procedure and the sedation                            options and risks were discussed with the patient.                            All questions were answered, and informed consent                            was obtained. Prior Anticoagulants: The patient has                            taken no anticoagulant or antiplatelet agents. ASA                            Grade Assessment: II - A patient with mild systemic                            disease. After reviewing the risks and benefits,                            the patient was deemed in satisfactory condition to                            undergo the procedure.  After obtaining informed consent, the colonoscope                            was passed under direct vision. Throughout the                            procedure, the patient's blood pressure, pulse, and                            oxygen saturations were monitored continuously. The                            Olympus CF-HQ190L SN [id number] was introduced through                            the anus and advanced to the the terminal  ileum.                            The colonoscopy was performed without difficulty.                            The patient tolerated the procedure well. The                            quality of the bowel preparation was excellent. The                            terminal ileum, ileocecal valve, appendiceal                            orifice, and rectum were photographed. Scope In: 11:20:27 AM Scope Out: 11:34:28 AM Scope Withdrawal Time: 0 hours 9 minutes 52 seconds  Total Procedure Duration: 0 hours 14 minutes 1 second  Findings:                 The terminal ileum appeared normal.                           A 6 mm polyp was found in the cecum. The polyp was                            sessile. The polyp was removed with a cold snare.                            Resection and retrieval were complete.                           A localized area of mildly erythematous mucosa was                            found in the rectum and in the sigmoid colon. This                            was biopsied  with a cold forceps for histology.                           Non-bleeding internal hemorrhoids were found during                            retroflexion.                           An anal fissure was found on perianal exam. Complications:            No immediate complications. Estimated Blood Loss:     Estimated blood loss was minimal. Impression:               - The examined portion of the ileum was normal.                           - One 6 mm polyp in the cecum, removed with a cold                            snare. Resected and retrieved.                           - Erythematous mucosa in the rectum and in the                            sigmoid colon. Biopsied.                           - Non-bleeding internal hemorrhoids.                           - Anal fissure found on perianal exam. Recommendation:           - Discharge patient to home (with escort).                           - Await pathology  results.                           - Will send diltiazem 2% and lidocaine 5% ointment                            to [facility] to be compounded. Will plan                            to apply a small amount in the rectum up to the                            first knuckle three tiems daily for 8 weeks.                           - Return to GI clinic in 8 weeks.                           -  The findings and recommendations were discussed                            with the patient. Dr Georgian Co "Lyndee Leo" Lorenso Courier,  09/30/2022 11:40:27 AM

## 2022-09-30 NOTE — Patient Instructions (Addendum)
Handouts on polyps and hemorrhoids given to patient. Await pathology results. Diltiazem 2% and lidocaine 5% ointment prescription will need to be picked up at [facility] - will plan to apply a small amount in the rectum up to the first knuckle three times daily for 8 weeks. Return to the GI office in 8 weeks - scheduled for 12/16/22 at 10:10 am (please call office to reschedule this if the time/date will not work for you)    YOU HAD AN ENDOSCOPIC PROCEDURE TODAY AT [facility]:   Refer to the procedure report that was given to you for any specific questions about what was found during the examination.  If the procedure report does not answer your questions, please call your gastroenterologist to clarify.  If you requested that your care partner not be given the details of your procedure findings, then the procedure report has been included in a sealed envelope for you to review at your convenience later.  YOU SHOULD EXPECT: Some feelings of bloating in the abdomen. Passage of more gas than usual.  Walking can help get rid of the air that was put into your GI tract during the procedure and reduce the bloating. If you had a lower endoscopy (such as a colonoscopy or flexible sigmoidoscopy) you may notice spotting of blood in your stool or on the toilet paper. If you underwent a bowel prep for your procedure, you may not have a normal bowel movement for a few days.  Please Note:  You might notice some irritation and congestion in your nose or some drainage.  This is from the oxygen used during your procedure.  There is no need for concern and it should clear up in a day or so.  SYMPTOMS TO REPORT IMMEDIATELY:  Following lower endoscopy (colonoscopy or flexible sigmoidoscopy):  Excessive amounts of blood in the stool  Significant tenderness or worsening of abdominal pains  Swelling of the abdomen that is new, acute  Fever of 100F or higher   For urgent or emergent issues, a  gastroenterologist can be reached at any hour by calling [phone]. Do not use MyChart messaging for urgent concerns.    DIET:  We do recommend a small meal at first, but then you may proceed to your regular diet.  Drink plenty of fluids but you should avoid alcoholic beverages for 24 hours.  ACTIVITY:  You should plan to take it easy for the rest of today and you should NOT DRIVE or use heavy machinery until tomorrow (because of the sedation medicines used during the test).    FOLLOW UP: Our staff will call the number listed on your records the next business day following your procedure.  We will call around 7:15- 8:00 am to check on you and address any questions or concerns that you may have regarding the information given to you following your procedure. If we do not reach you, we will leave a message.     If any biopsies were taken you will be contacted by phone or by letter within the next 1-3 weeks.  Please call us at [phone] if you have not heard about the biopsies in 3 weeks.    SIGNATURES/CONFIDENTIALITY: You and/or your care partner have signed paperwork which will be entered into your electronic medical record.  These signatures attest to the fact that that the information above on your After Visit Summary has been reviewed and is understood.  Full responsibility of the confidentiality of this  discharge information lies with you and/or your care-partner.

## 2022-10-01 ENCOUNTER — Telehealth: Payer: Self-pay

## 2022-10-01 NOTE — Telephone Encounter (Signed)
  Follow up Call-     09/30/2022   10:30 AM  Call back number  Post procedure Call Back phone  # [phone]  Permission to leave phone message Yes     Patient questions:  Do you have a fever, pain , or abdominal swelling? No. Pain Score  0 *  Have you tolerated food without any problems? Yes.    Have you been able to return to your normal activities? Yes.    Do you have any questions about your discharge instructions: Diet   No. Medications  No. Follow up visit  No.  Do you have questions or concerns about your Care? No.  Actions: * If pain score is 4 or above: No action needed, pain <4.

## 2022-10-05 ENCOUNTER — Encounter: Payer: Self-pay | Admitting: Internal Medicine

## 2022-12-16 ENCOUNTER — Ambulatory Visit (INDEPENDENT_AMBULATORY_CARE_PROVIDER_SITE_OTHER): Payer: No Typology Code available for payment source | Admitting: Internal Medicine

## 2022-12-16 ENCOUNTER — Encounter: Payer: Self-pay | Admitting: Internal Medicine

## 2022-12-16 VITALS — BP 100/70 | HR 84 | Ht 71.0 in | Wt 194.4 lb

## 2022-12-16 DIAGNOSIS — Z86010 Personal history of colonic polyps: Secondary | ICD-10-CM | POA: Diagnosis not present

## 2022-12-16 DIAGNOSIS — K649 Unspecified hemorrhoids: Secondary | ICD-10-CM | POA: Diagnosis not present

## 2022-12-16 DIAGNOSIS — Z8719 Personal history of other diseases of the digestive system: Secondary | ICD-10-CM | POA: Diagnosis not present

## 2022-12-16 NOTE — Progress Notes (Signed)
Chief Complaint: Rectal pain, abdominal pain  HPI:    Frank Haynes is a 42 year old male with history of GERD, anal fissure, colon polyps, and hemorrhoids presents to clinic for follow up of rectal pain and abdominal pain  Interval History: He changed his diet to eating high fiber and high protein and has been exercising more. With these changes, he has had significant improvement in his GI symptoms. The diltiazem ointment helped with his anal fissure. He is no longer having any rectal pain or abdominal pain. He has not ben having as much constipation. He is having on average one BM per day.  He now only has acid reflux on occasion (for instance after he drinks some alcohol)  Past Medical History:  Diagnosis Date   GERD (gastroesophageal reflux disease)     Past Surgical History:  Procedure Laterality Date   TOOTH EXTRACTION      Current Outpatient Medications  Medication Sig Dispense Refill   AMBULATORY NON FORMULARY MEDICATION Medication Name: diltiazem 2% and lidocaine 5% ointment. Will plan to apply a small amount I'm the rectum up to the first knuckle  three times a day for 8 weeks 30 g 1   Multiple Vitamins-Minerals (MENS MULTIVITAMIN) TABS Take 1 tablet by mouth daily.     acetaminophen (TYLENOL) 500 MG tablet Take 500 mg by mouth every 6 (six) hours as needed for moderate pain or mild pain. (Patient not taking: Reported on 12/16/2022)     ibuprofen (ADVIL,MOTRIN) 800 MG tablet Take 1 tablet (800 mg total) every 8 (eight) hours as needed by mouth. (Patient not taking: Reported on 07/28/2022) 21 tablet 0   omeprazole (PRILOSEC) 20 MG capsule Take 1 capsule (20 mg total) by mouth daily. (Patient not taking: Reported on 07/28/2022) 30 capsule 0   ondansetron (ZOFRAN-ODT) 4 MG disintegrating tablet Take 1 tablet (4 mg total) by mouth every 8 (eight) hours as needed for nausea or vomiting. (Patient not taking: Reported on 07/28/2022) 20 tablet 0   oxyCODONE (ROXICODONE) 5 MG immediate release  tablet Take 1 tablet (5 mg total) by mouth every 6 (six) hours as needed for up to 10 doses for breakthrough pain. (Patient not taking: Reported on 07/28/2022) 10 tablet 0   No current facility-administered medications for this visit.    Allergies as of 12/16/2022   (No Known Allergies)    Family History  Problem Relation Age of Onset   Colon cancer Neg Hx    Esophageal cancer Neg Hx    Rectal cancer Neg Hx    Stomach cancer Neg Hx     Social History   Socioeconomic History   Marital status: Single    Spouse name: Not on file   Number of children: 4   Years of education: Not on file   Highest education level: Not on file  Occupational History   Occupation: customer service [employer]  Tobacco Use   Smoking status: Never   Smokeless tobacco: Never  Vaping Use   Vaping Use: Former  Substance and Sexual Activity   Alcohol use: Yes    Comment: wine once a month   Drug use: Not Currently   Sexual activity: Yes  Other Topics Concern   Not on file  Social History Narrative    4 children - 2 boys, 2 girls   2 boys live at home with patient   No caffeine   Exercise 4-5x a week    Social Determinants of Health   Financial Resource Strain: Not  on file  Food Insecurity: Not on file  Transportation Needs: Not on file  Physical Activity: Not on file  Stress: Not on file  Social Connections: Not on file  Intimate Partner Violence: Not on file     Physical Exam:  Vital signs: BP 100/70 (BP Location: Left Arm, Patient Position: Sitting, Cuff Size: Normal)   Pulse 84   Ht 5\' 11"  (1.803 m)   Wt 194 lb 6 oz (88.2 kg)   BMI 27.11 kg/m   Constitutional:   Pleasant AA male appears to be in NAD, Well developed, Well nourished, alert and cooperative Head:  Normocephalic and atraumatic. Respiratory: Respirations even and unlabored. Lungs clear to auscultation bilaterally.   No wheezes, crackles, or rhonchi.  Cardiovascular: Normal S1, S2. No MRG. Regular rate and rhythm. No  peripheral edema, cyanosis or pallor.  Gastrointestinal:  Soft, nondistended, non-tender, ventral hernia.  Neurologic:  Alert and  oriented x4;  grossly normal neurologically.  Skin:   Dry and intact without significant lesions or rashes. Psychiatric: Demonstrates good judgement and reason without abnormal affect or behaviors.  RELEVANT LABS AND IMAGING: CBC    Component Value Date/Time   WBC 13.7 (H) 06/23/2022 0900   RBC 4.77 06/23/2022 0900   HGB 13.9 06/23/2022 0900   HCT 42.6 06/23/2022 0900   PLT 325 06/23/2022 0900   MCV 89.3 06/23/2022 0900   MCH 29.1 06/23/2022 0900   MCHC 32.6 06/23/2022 0900   RDW 11.6 06/23/2022 0900   LYMPHSABS 2.8 06/06/2017 1229   MONOABS 0.6 06/06/2017 1229   EOSABS 0.1 06/06/2017 1229   BASOSABS 0.0 06/06/2017 1229    CMP     Component Value Date/Time   NA 140 06/23/2022 0900   K 3.6 06/23/2022 0900   CL 106 06/23/2022 0900   CO2 25 06/23/2022 0900   GLUCOSE 129 (H) 06/23/2022 0900   BUN 16 06/23/2022 0900   CREATININE 1.04 06/23/2022 0900   CALCIUM 9.5 06/23/2022 0900   PROT 7.7 06/23/2022 0900   ALBUMIN 4.3 06/23/2022 0900   AST 21 06/23/2022 0900   ALT 20 06/23/2022 0900   ALKPHOS 74 06/23/2022 0900   BILITOT 1.1 06/23/2022 0900   GFRNONAA >60 06/23/2022 0900   GFRAA >60 06/06/2017 1229   Colonoscopy 09/30/22:  Path: 1. Surgical [P], colon, cecum, polyp (1) - TUBULAR ADENOMA. - NO HIGH GRADE DYSPLASIA OR MALIGNANCY. 2. Surgical [P], left colon bx - BENIGN COLONIC MUCOSA WITH NO SPECIFIC PATHOLOGIC CHANGES - NEGATIVE FOR INCREASED INTRAEPITHELIAL LYMPHOCYTES OR THICKENED SUBEPITHELIAL COLLAGEN TABLE - NEGATIVE FOR DYSPLASIA OR MALIGNANCY  Assessment: Anal fissure - resolved Abdominal pain - resolved History of colon polyps GERD Patient has had significant improvement in his abdominal pain, rectal pain, and constipation with diet changes and exercise. He on average has one BM per day currently. Anal fissure found on last  colonoscopy has healed with diltiazem/lidocaine ointment as well as behavioral changes. He has mild issues with GERD only when he drinks alcohol. I gave him instructions on when to be more concerned about his ventral hernia.  - Next colonoscopy will be due in 09/2029 for history of colon polyps - RTC PRN  Eulah Pont, MD [facility] 12/16/2022, 10:43 AM  I spent 30 minutes of time, including in depth chart review, independent review of results as outlined above, communicating results with the patient directly, face-to-face time with the patient, coordinating care, and ordering studies and medications as appropriate, and documentation.

## 2022-12-16 NOTE — Patient Instructions (Signed)
Glad you are feeling better.   Return as needed. _____________________________________________________  If your blood pressure at your visit was 140/90 or greater, please contact your primary care physician to follow up on this.  _______________________________________________________  If you are age 40 or older, your body mass index should be between 23-30. Your Body mass index is 27.11 kg/m. If this is out of the aforementioned range listed, please consider follow up with your Primary Care Provider.  If you are age 15 or younger, your body mass index should be between 19-25. Your Body mass index is 27.11 kg/m. If this is out of the aformentioned range listed, please consider follow up with your Primary Care Provider.   ________________________________________________________  The [facility] providers would like to encourage you to use [facility] to communicate with providers for non-urgent requests or questions.  Due to long hold times on the telephone, sending your provider a message by [facility] may be a faster and more efficient way to get a response.  Please allow 48 business hours for a response.  Please remember that this is for non-urgent requests.  _______________________________________________________
